# Patient Record
Sex: Female | Born: 1995 | Race: White | Hispanic: No | Marital: Single | Smoking: Former smoker
Health system: Southern US, Community
[De-identification: ages and names within clinical notes are randomized; demographics above are authoritative.]

## PROBLEM LIST (undated history)

## (undated) ENCOUNTER — Inpatient Hospital Stay (HOSPITAL_COMMUNITY): Payer: Self-pay

## (undated) DIAGNOSIS — R51 Headache: Secondary | ICD-10-CM

## (undated) DIAGNOSIS — I10 Essential (primary) hypertension: Secondary | ICD-10-CM

## (undated) DIAGNOSIS — F32A Depression, unspecified: Secondary | ICD-10-CM

## (undated) DIAGNOSIS — O0990 Supervision of high risk pregnancy, unspecified, unspecified trimester: Secondary | ICD-10-CM

## (undated) DIAGNOSIS — F411 Generalized anxiety disorder: Secondary | ICD-10-CM

## (undated) DIAGNOSIS — E669 Obesity, unspecified: Secondary | ICD-10-CM

## (undated) DIAGNOSIS — D649 Anemia, unspecified: Secondary | ICD-10-CM

## (undated) DIAGNOSIS — F329 Major depressive disorder, single episode, unspecified: Secondary | ICD-10-CM

## (undated) DIAGNOSIS — F902 Attention-deficit hyperactivity disorder, combined type: Secondary | ICD-10-CM

## (undated) DIAGNOSIS — F909 Attention-deficit hyperactivity disorder, unspecified type: Secondary | ICD-10-CM

## (undated) DIAGNOSIS — R519 Headache, unspecified: Secondary | ICD-10-CM

---
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
## 2014-02-05 ENCOUNTER — Encounter (HOSPITAL_COMMUNITY): Payer: Self-pay | Admitting: Emergency Medicine
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
## 2014-07-18 ENCOUNTER — Emergency Department (HOSPITAL_COMMUNITY): Payer: Medicaid Other

## 2014-07-18 ENCOUNTER — Encounter (HOSPITAL_COMMUNITY): Payer: Self-pay | Admitting: *Deleted

## 2014-07-18 ENCOUNTER — Emergency Department (HOSPITAL_COMMUNITY)
Admission: EM | Admit: 2014-07-18 | Discharge: 2014-07-19 | Disposition: A | Discharge status: Home / Self Care | Payer: Medicaid Other | Attending: Emergency Medicine | Admitting: Emergency Medicine

## 2014-07-18 DIAGNOSIS — R05 Cough: Secondary | ICD-10-CM | POA: Diagnosis not present

## 2014-07-18 DIAGNOSIS — R109 Unspecified abdominal pain: Secondary | ICD-10-CM | POA: Diagnosis present

## 2014-07-18 DIAGNOSIS — Z8659 Personal history of other mental and behavioral disorders: Secondary | ICD-10-CM | POA: Insufficient documentation

## 2014-07-18 DIAGNOSIS — Z87891 Personal history of nicotine dependence: Secondary | ICD-10-CM | POA: Diagnosis not present

## 2014-07-18 DIAGNOSIS — E669 Obesity, unspecified: Secondary | ICD-10-CM | POA: Insufficient documentation

## 2014-07-18 DIAGNOSIS — R8271 Bacteriuria: Secondary | ICD-10-CM

## 2014-07-18 DIAGNOSIS — Z3202 Encounter for pregnancy test, result negative: Secondary | ICD-10-CM | POA: Insufficient documentation

## 2014-07-18 DIAGNOSIS — N390 Urinary tract infection, site not specified: Secondary | ICD-10-CM | POA: Diagnosis not present

## 2014-07-18 DIAGNOSIS — R0781 Pleurodynia: Secondary | ICD-10-CM

## 2014-07-18 LAB — CBC WITH DIFFERENTIAL/PLATELET
BASOS ABS: 0 10*3/uL (ref 0.0–0.1)
Basophils Relative: 0 % (ref 0–1)
EOS PCT: 1 % (ref 0–5)
Eosinophils Absolute: 0.1 10*3/uL (ref 0.0–0.7)
HCT: 40.5 % (ref 36.0–46.0)
Hemoglobin: 13.6 g/dL (ref 12.0–15.0)
Lymphocytes Relative: 31 % (ref 12–46)
Lymphs Abs: 2.7 10*3/uL (ref 0.7–4.0)
MCH: 29.1 pg (ref 26.0–34.0)
MCHC: 33.6 g/dL (ref 30.0–36.0)
MCV: 86.7 fL (ref 78.0–100.0)
Monocytes Absolute: 0.7 10*3/uL (ref 0.1–1.0)
Monocytes Relative: 8 % (ref 3–12)
Neutro Abs: 5.2 10*3/uL (ref 1.7–7.7)
Neutrophils Relative %: 60 % (ref 43–77)
Platelets: 376 10*3/uL (ref 150–400)
RBC: 4.67 MIL/uL (ref 3.87–5.11)
RDW: 12.7 % (ref 11.5–15.5)
WBC: 8.7 10*3/uL (ref 4.0–10.5)

## 2014-07-18 LAB — COMPREHENSIVE METABOLIC PANEL
ALBUMIN: 3.9 g/dL (ref 3.5–5.2)
ALK PHOS: 108 U/L (ref 39–117)
ALT: 42 U/L — AB (ref 0–35)
AST: 25 U/L (ref 0–37)
Anion gap: 9 (ref 5–15)
BUN: 13 mg/dL (ref 6–23)
CO2: 25 mmol/L (ref 19–32)
CREATININE: 0.96 mg/dL (ref 0.50–1.10)
Calcium: 9.2 mg/dL (ref 8.4–10.5)
Chloride: 104 mmol/L (ref 96–112)
GFR calc Af Amer: >90 mL/min (ref >90)
GFR calc non Af Amer: 85 mL/min — ABNORMAL LOW (ref >90)
Glucose, Bld: 94 mg/dL (ref 70–99)
POTASSIUM: 3.9 mmol/L (ref 3.5–5.1)
Sodium: 138 mmol/L (ref 135–145)
Total Bilirubin: 0.5 mg/dL (ref 0.3–1.2)
Total Protein: 6.7 g/dL (ref 6.0–8.3)

## 2014-07-18 LAB — LIPASE, BLOOD: Lipase: 23 U/L (ref 11–59)

## 2014-07-18 NOTE — ED Notes (Signed)
Pt ambulated to restroom w/ steady gait.

## 2014-07-18 NOTE — ED Notes (Signed)
The pt is c/o lt flank pain for 3 weeks. She was seen one week ago and diagnosed with a uti.  She does not think it is a uti.  She was given meds but did niot get them filled no money.  She is oin her celll phone. Testing while being triaged.  Her periods have been irregular  Since she dekivered over a year ago.  lmp now

## 2014-07-18 NOTE — ED Provider Notes (Signed)
CSN: [id number]     Arrival date & time 07/18/14  2129 History   First MD Initiated Contact with Patient 07/18/14 2218     Chief Complaint  Patient presents with  . Flank Pain     (Consider location/radiation/quality/duration/timing/severity/associated sxs/prior Treatment) HPI  19 year old female presents with left midaxillary pain for the past 3 weeks. She was seen at [facility] one week ago and diagnosed with a UTI. She was put on antibiotics but did not have the money to fill them. She states the pain initially was constant 3 weeks ago but now has become more intermittent. He time she does anything such as cough or move a certain way it exacerbates the pain. No nausea or vomiting. She has had gallbladder issues but has had her gallbladder out. She denies any fevers or chills. No urinary symptoms including no dysuria or hematuria. Is currently on her menstrual cycle. No back pain. Coughs intermittently but no worse than typical, patient is a smoker. No pain currently while patient is at rest.  Past Medical History  Diagnosis Date  . Obesity   . 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 ADHD (attention deficit hyperactivity disorder) 04/17/2013   Past Surgical History  Procedure Laterality Date  . No past surgeries    . Cesarean section N/A 03/15/2013    Procedure: CESAREAN SECTION;  Surgeon: Catalina Antigua, MD;  Location: [facility];  Service: Obstetrics;  Laterality: N/A;  . Cholecystectomy N/A 04/16/2013    Procedure: LAPAROSCOPIC CHOLECYSTECTOMY WITH INTRAOPERATIVE CHOLANGIOGRAM;  Surgeon: Adolph Pollack, MD;  Location: [facility];  Service: General;  Laterality: N/A;   Family History  Problem Relation Age of Onset  . Adopted: Yes  . Drug abuse Mother   . Drug abuse Father    History  Substance Use Topics  . Smoking status: Former Games developer  . Smokeless tobacco: Never Used  . Alcohol Use: No     Comment: "maybe monthly"   OB History    Gravida  Para Term Preterm AB TAB SAB Ectopic Multiple Living   0 0 0 0 0 0 1     Review of Systems  Constitutional: Negative for fever.  Respiratory: Positive for cough. Negative for shortness of breath.   Cardiovascular: Negative for chest pain.  Gastrointestinal: Negative for nausea, vomiting and diarrhea.  Genitourinary: Positive for flank pain and vaginal bleeding (on period now). Negative for dysuria, urgency, frequency, hematuria, decreased urine volume and vaginal discharge.  Musculoskeletal: Negative for back pain.  All other systems reviewed and are negative.     Allergies  Review of patient's allergies indicates no known allergies.  Home Medications   Prior to Admission medications   Medication Sig Start Date End Date Taking? Authorizing Provider  bacitracin ointment Apply 1 application topically 2 (two) times daily. Apply to abrasions twice daily. Patient not taking: Reported on 07/18/2014 12/09/13   Purvis Sheffield, MD  oxyCODONE-acetaminophen (PERCOCET) 5-325 MG per tablet Take 1 tablet by mouth every 6 (six) hours as needed for moderate pain. Patient not taking: Reported on 07/18/2014 12/09/13   Purvis Sheffield, MD   BP 115/75 mmHg  Pulse 89  Temp(Src) 98.9 F (37.2 C)  Resp 18  Ht  (1.651 m)  Wt 175 lb (79.379 kg)  BMI 29.12 kg/m2  SpO2 100%  LMP 07/18/2014 Physical Exam  Constitutional: She is oriented to person, place, and time. She appears well-developed and well-nourished.  HENT:  Head: Normocephalic and atraumatic.  Right Ear: External ear normal.  Left Ear: External ear normal.  Nose: Nose normal.  Eyes: Right eye exhibits no discharge. Left eye exhibits no discharge.  Cardiovascular: Normal rate, regular rhythm and normal heart sounds.   Pulmonary/Chest: Effort normal and breath sounds normal. She has no wheezes. She has no rales.  Abdominal: Soft. There is no tenderness. There is no CVA tenderness.    Neurological: She is alert and oriented to  person, place, and time.  Skin: Skin is warm and dry. She is not diaphoretic.  Nursing note and vitals reviewed.   ED Course  Procedures (including critical care time) Labs Review Labs Reviewed  COMPREHENSIVE METABOLIC PANEL - Abnormal; Notable for the following:    ALT 42 (*)    GFR calc non Af Amer 85 (*)    All other components within normal limits  URINALYSIS, ROUTINE W REFLEX MICROSCOPIC - Abnormal; Notable for the following:    APPearance CLOUDY (*)    Leukocytes, UA LARGE (*)    All other components within normal limits  URINE MICROSCOPIC-ADD ON - Abnormal; Notable for the following:    Bacteria, UA FEW (*)    All other components within normal limits  CBC WITH DIFFERENTIAL/PLATELET  LIPASE, BLOOD  POC URINE PREG, ED    Imaging Review Dg Chest 2 View  07/18/2014   CLINICAL DATA:  Left lower axillary chest and flank pain, intermittent for 3 weeks. Dry cough and sore throat for 3 days.  EXAM: CHEST  2 VIEW  COMPARISON:  11/05/2013  FINDINGS: The cardiomediastinal contours are normal. The lungs are clear. Pulmonary vasculature is normal. No consolidation, pleural effusion, or pneumothorax. No acute osseous abnormalities are seen. Bilateral nipple rings present.  IMPRESSION: No acute pulmonary process.   Electronically Signed   By: Rubye OaksMelanie  Ehinger M.D.   On: 07/18/2014 23:39     EKG Interpretation None      MDM   Final diagnoses:  Rib pain on left side  Asymptomatic bacteriuria    After informed consent, outside records from [facility] were obtained. On 07/04/2014 she had a negative CT of the abdomen and pelvis without contrast. No evidence of stones or other acute abnormalities. At that time she also had a urinalysis that showed trace leukocytes. The urine culture did come back with greater than 100,000 Escherichia coli that was pansensitive. Patient has large leukocytes in her urine here but continues to have no urinary symptoms. I believe this is asymptomatic  bacteriuria. She is saying she has flank pain but when evaluated the pain is specifically over her lower ribs. I believe this is a muscle etiology. No CVA tenderness or suprapubic tenderness. Lab work is otherwise unremarkable. I recommended ibuprofen and Tylenol and discussed options for her a symptom back bacteria. She's not want antibiotics at this time. Discussed strict return precautions and have recommended follow-up with a PCP.    Pricilla LovelessScott Lakrisha Iseman, MD 07/19/14 [phone]

## 2014-07-19 LAB — URINE MICROSCOPIC-ADD ON

## 2014-07-19 LAB — URINALYSIS, ROUTINE W REFLEX MICROSCOPIC
Bilirubin Urine: NEGATIVE
GLUCOSE, UA: NEGATIVE mg/dL
HGB URINE DIPSTICK: NEGATIVE
Ketones, ur: NEGATIVE mg/dL
Nitrite: NEGATIVE
PH: 7.5 (ref 5.0–8.0)
Protein, ur: NEGATIVE mg/dL
Specific Gravity, Urine: 1.026 (ref 1.005–1.030)
Urobilinogen, UA: 0.2 mg/dL (ref 0.0–1.0)

## 2014-07-19 LAB — POC URINE PREG, ED: PREG TEST UR: NEGATIVE

## 2014-07-19 NOTE — Discharge Instructions (Signed)
Asymptomatic Bacteriuria Asymptomatic bacteriuria is the presence of a large number of bacteria in your urine without the usual symptoms of burning or frequent urination. The following conditions increase the risk of asymptomatic bacteriuria:  Diabetes mellitus.  Advanced age.  Pregnancy in the first trimester.  Kidney stones.  Kidney transplants.  Leaky kidney tube valve in young children (reflux). Treatment for this condition is not needed in most people and can lead to other problems such as too much yeast and growth of resistant bacteria. However, some people, such as pregnant women, do need treatment to prevent kidney infection. Asymptomatic bacteriuria in pregnancy is also associated with fetal growth restriction, premature labor, and newborn death. HOME CARE INSTRUCTIONS Monitor your condition for any changes. The following actions may help to relieve any discomfort you are feeling:  Drink enough water and fluids to keep your urine clear or pale yellow. Go to the bathroom more often to keep your bladder empty.  Keep the area around your vagina and rectum clean. Wipe yourself from front to back after urinating. SEEK IMMEDIATE MEDICAL CARE IF:  You develop signs of an infection such as:  Burning with urination.  Frequency of voiding.  Back pain.  Fever.  You have blood in the urine.  You develop a fever. MAKE SURE YOU:  Understand these instructions.  Will watch your condition.  Will get help right away if you are not doing well or get worse. Document Released: 03/23/2005 Document Revised: 08/07/2013 Document Reviewed: 09/12/2012 [facility] Patient Information 2015 [place], [place]. This information is not intended to replace advice given to you by your health care provider. Make sure you discuss any questions you have with your health care provider.    Chest Wall Pain Chest wall pain is pain in or around the bones and muscles of your chest. It may take up to 6  weeks to get better. It may take longer if you must stay physically active in your work and activities.  CAUSES  Chest wall pain may happen on its own. However, it may be caused by:  A viral illness like the flu.  Injury.  Coughing.  Exercise.  Arthritis.  Fibromyalgia.  Shingles. HOME CARE INSTRUCTIONS   Avoid overtiring physical activity. Try not to strain or perform activities that cause pain. This includes any activities using your chest or your abdominal and side muscles, especially if heavy weights are used.  Put ice on the sore area.  Put ice in a plastic bag.  Place a towel between your skin and the bag.  Leave the ice on for 15-20 minutes per hour while awake for the first 2 days.  Only take over-the-counter or prescription medicines for pain, discomfort, or fever as directed by your caregiver. SEEK IMMEDIATE MEDICAL CARE IF:   Your pain increases, or you are very uncomfortable.  You have a fever.  Your chest pain becomes worse.  You have new, unexplained symptoms.  You have nausea or vomiting.  You feel sweaty or lightheaded.  You have a cough with phlegm (sputum), or you cough up blood. MAKE SURE YOU:   Understand these instructions.  Will watch your condition.  Will get help right away if you are not doing well or get worse. Document Released: 03/23/2005 Document Revised: 06/15/2011 Document Reviewed: 11/17/2010 [facility] Patient Information 2015 [place], [place]. This information is not intended to replace advice given to you by your health care provider. Make sure you discuss any questions you have with your health care provider.  Flank Pain Flank pain refers to pain that is located on the side of the body between the upper abdomen and the back. The pain may occur over a short period of time (acute) or may be long-term or reoccurring (chronic). It may be mild or severe. Flank pain can be caused by many things. CAUSES  Some of the more common  causes of flank pain include:  Muscle strains.   Muscle spasms.   A disease of your spine (vertebral disk disease).   A lung infection (pneumonia).   Fluid around your lungs (pulmonary edema).   A kidney infection.   Kidney stones.   A very painful skin rash caused by the chickenpox virus (shingles).   Gallbladder disease.  HOME CARE INSTRUCTIONS  Home care will depend on the cause of your pain. In general,  Rest as directed by your caregiver.  Drink enough fluids to keep your urine clear or pale yellow.  Only take over-the-counter or prescription medicines as directed by your caregiver. Some medicines may help relieve the pain.  Tell your caregiver about any changes in your pain.  Follow up with your caregiver as directed. SEEK IMMEDIATE MEDICAL CARE IF:   Your pain is not controlled with medicine.   You have new or worsening symptoms.  Your pain increases.   You have abdominal pain.   You have shortness of breath.   You have persistent nausea or vomiting.   You have swelling in your abdomen.   You feel faint or pass out.   You have blood in your urine.  You have a fever or persistent symptoms for more than 2-3 days.  You have a fever and your symptoms suddenly get worse. MAKE SURE YOU:   Understand these instructions.  Will watch your condition.  Will get help right away if you are not doing well or get worse. Document Released: 05/14/2005 Document Revised: 12/16/2011 Document Reviewed: 11/05/2011 [facility] Patient Information 2015 [place], [place]. This information is not intended to replace advice given to you by your health care provider. Make sure you discuss any questions you have with your health care provider.

## 2014-08-04 IMAGING — US US ABDOMEN COMPLETE
1 series · 14 of 25 positions shown · non-contrast
Comparison: None.

CLINICAL DATA: Abdominal pain.  Rule out cholelithiasis.

EXAM:
ULTRASOUND ABDOMEN COMPLETE

[Series 1: us abdomen complete · 0.27mm/px · 14 of 73 slices shown]
[im 1/73]
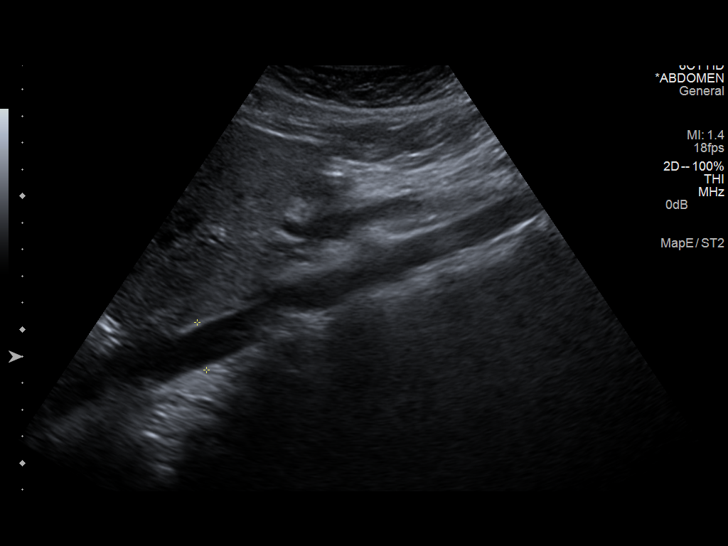
[im 7/73]
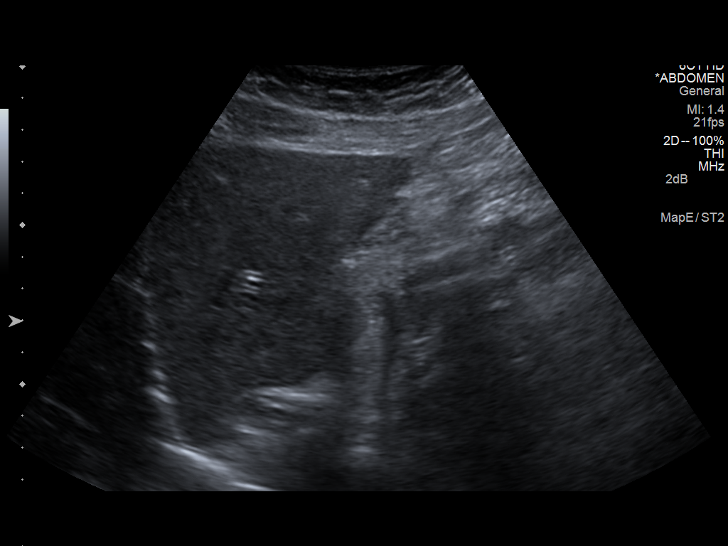
[im 13/73]
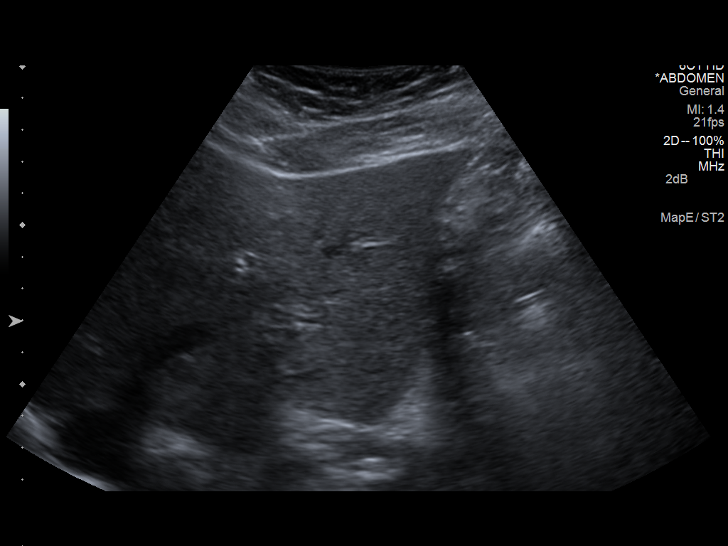
[im 19/73]
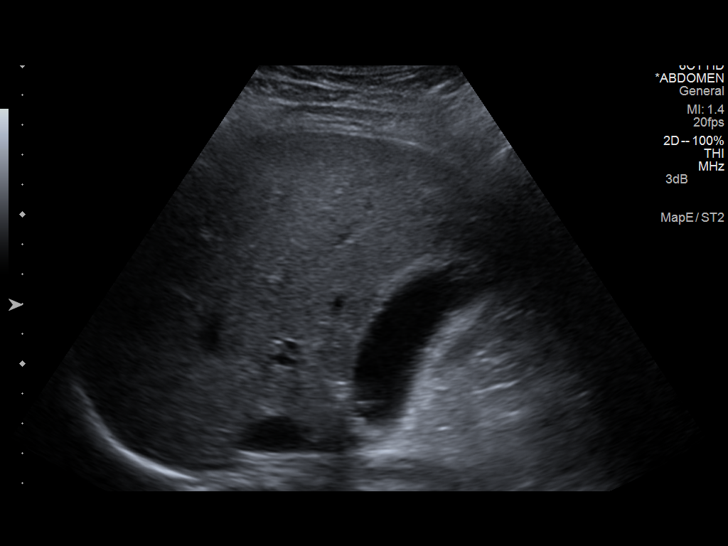
[im 25/73]
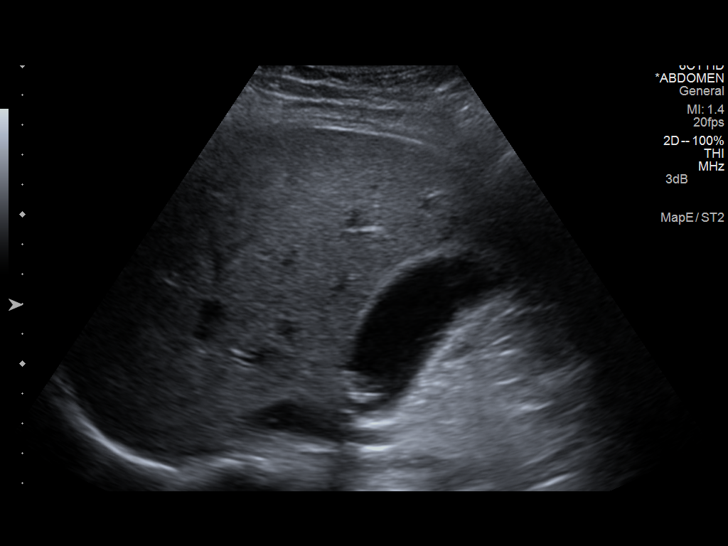
[im 28/73]
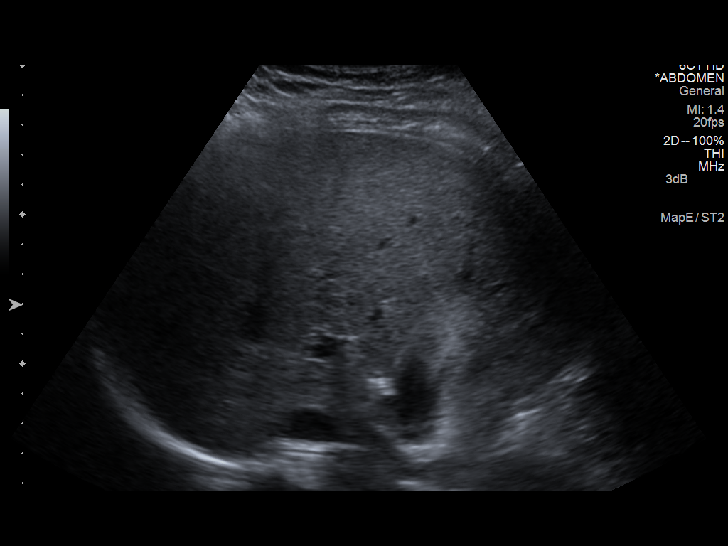
[im 34/73]
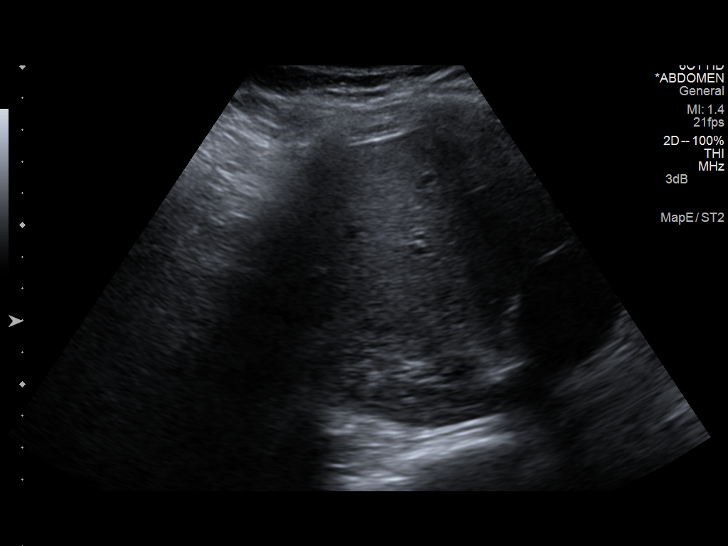
[im 40/73]
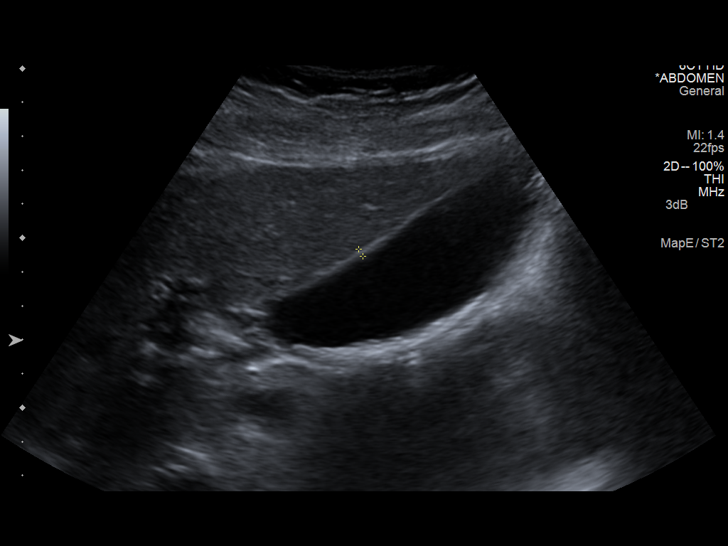
[im 46/73]
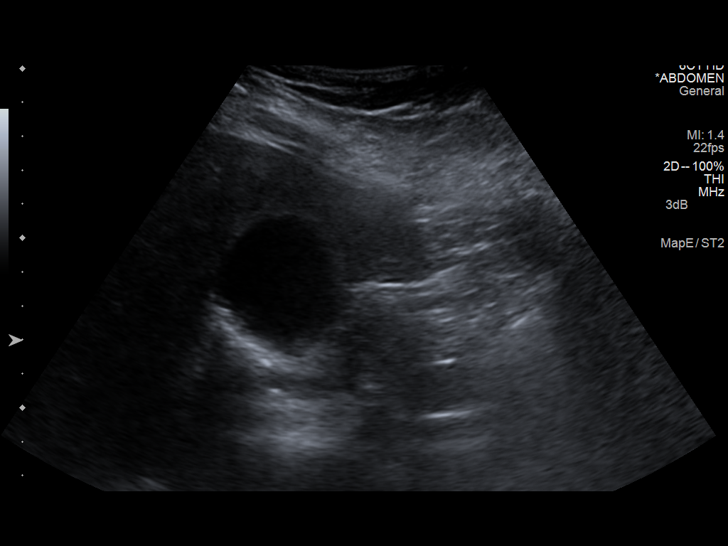
[im 49/73]
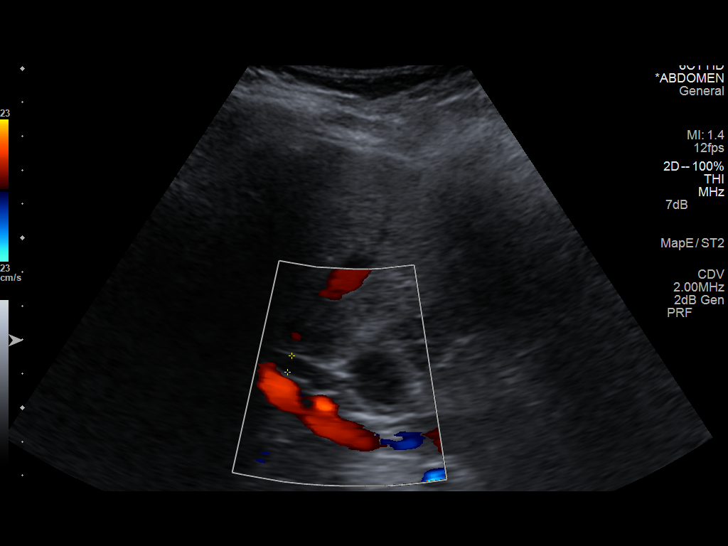
[im 55/73]
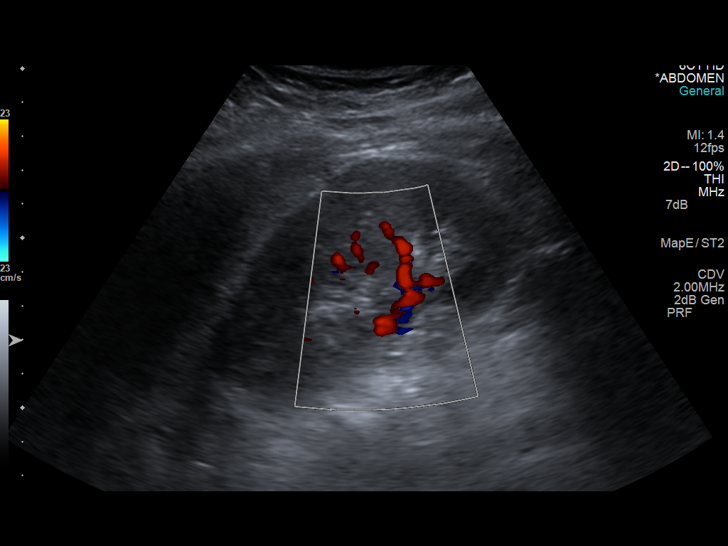
[im 61/73]
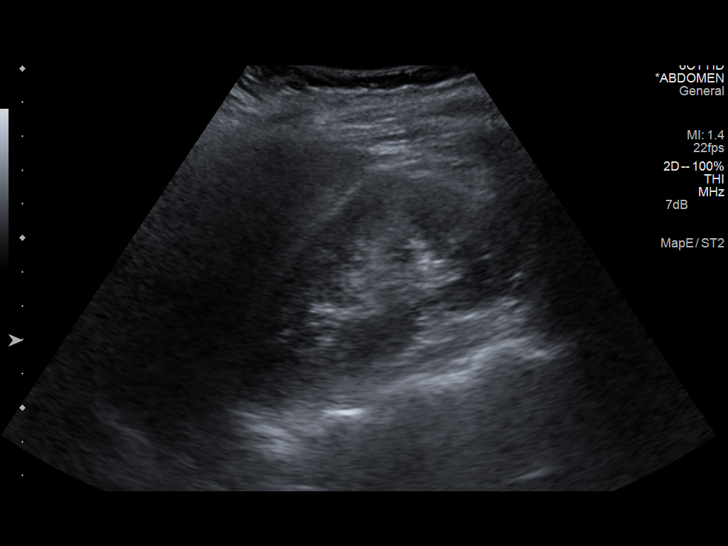
[im 67/73]
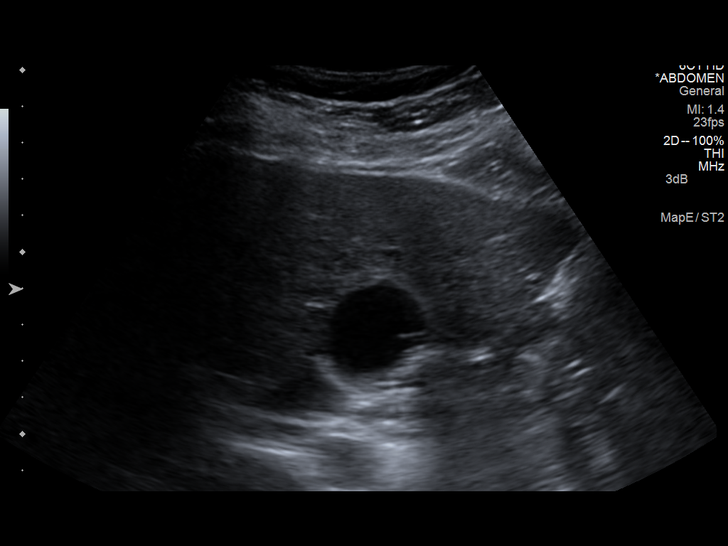
[im 73/73]
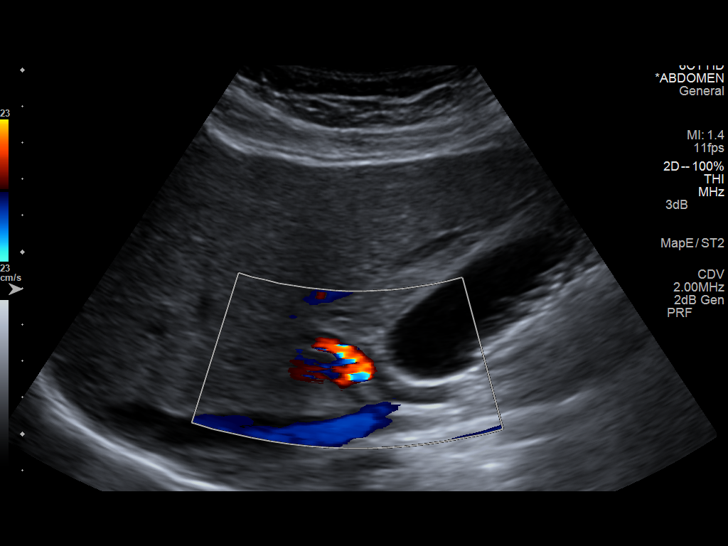

[14 of 25 positions shown; findings below may reference images not displayed]

FINDINGS: Gallbladder:

Multiple gallstones. Mobile. No wall thickening or pericholecystic
fluid. Sonographic Murphy's sign was not elicited.

Common bile duct:

Diameter: Normal, 5 mm.

Liver:

No focal lesion identified. Within normal limits in parenchymal
echogenicity.

IVC:

No abnormality visualized.

Pancreas:

Visualized portion unremarkable.

Spleen:

Size and appearance within normal limits.

Right Kidney:

Length: 10.2 cm. Echogenicity within normal limits. No mass or
hydronephrosis visualized.

Left Kidney:

Length: 10.3 cm. Echogenicity within normal limits. No mass or
hydronephrosis visualized.

Abdominal aorta:

No aneurysm visualized.

Other findings:

None.
IMPRESSION: Cholelithiasis without acute cholecystitis or biliary ductal
dilatation.

## 2014-08-06 IMAGING — RF DG CHOLANGIOGRAM OPERATIVE
1 series · 4 of 4 positions shown · non-contrast
Comparison: 04/14/2013

CLINICAL DATA: Cholecystectomy, intraoperative cholangiogram,
cholelithiasis

EXAM:
INTRAOPERATIVE CHOLANGIOGRAM
TECHNIQUE: Cholangiographic images from the C-arm fluoroscopic device were
submitted for interpretation post-operatively. Please see the
procedural report for the amount of contrast and the fluoroscopy
time utilized.

[Series 1: run · 4 of 77 frames shown]
[frame 12/77]
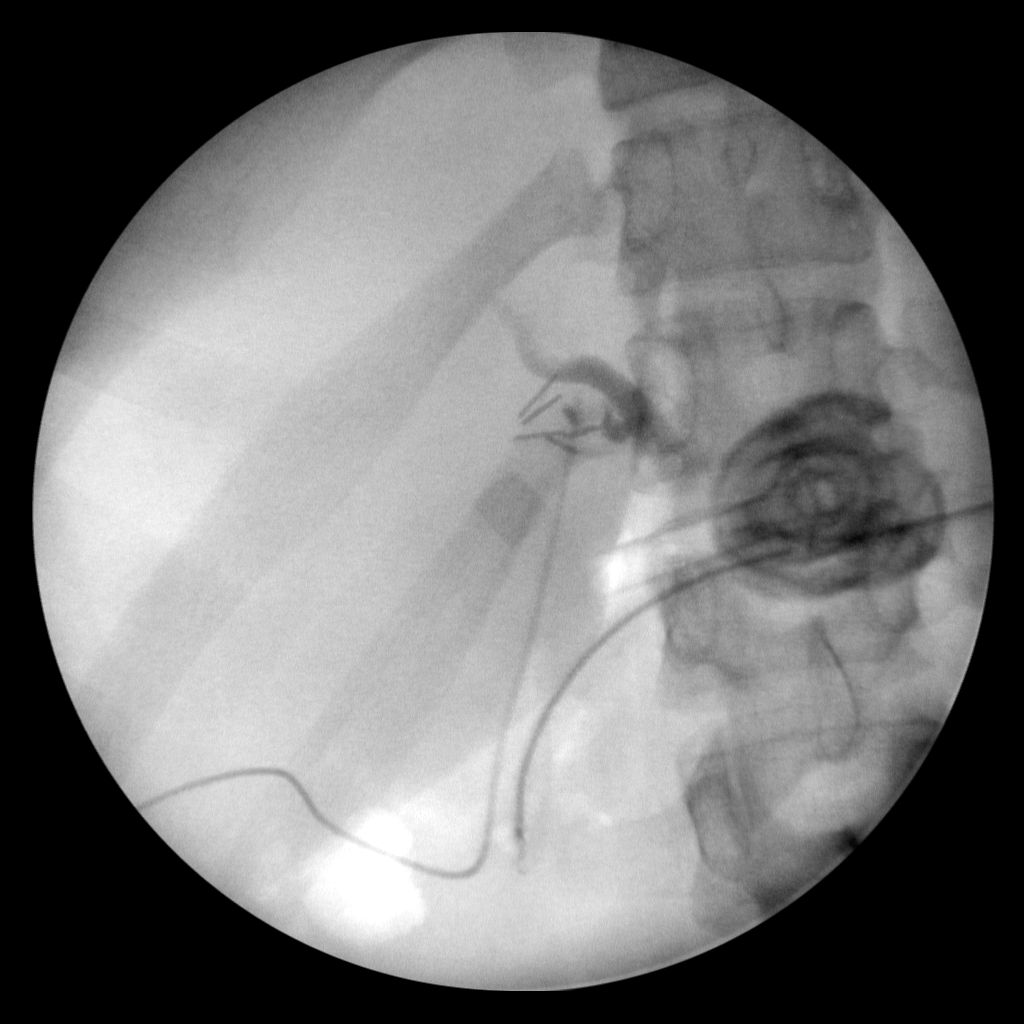
[frame 39/77]
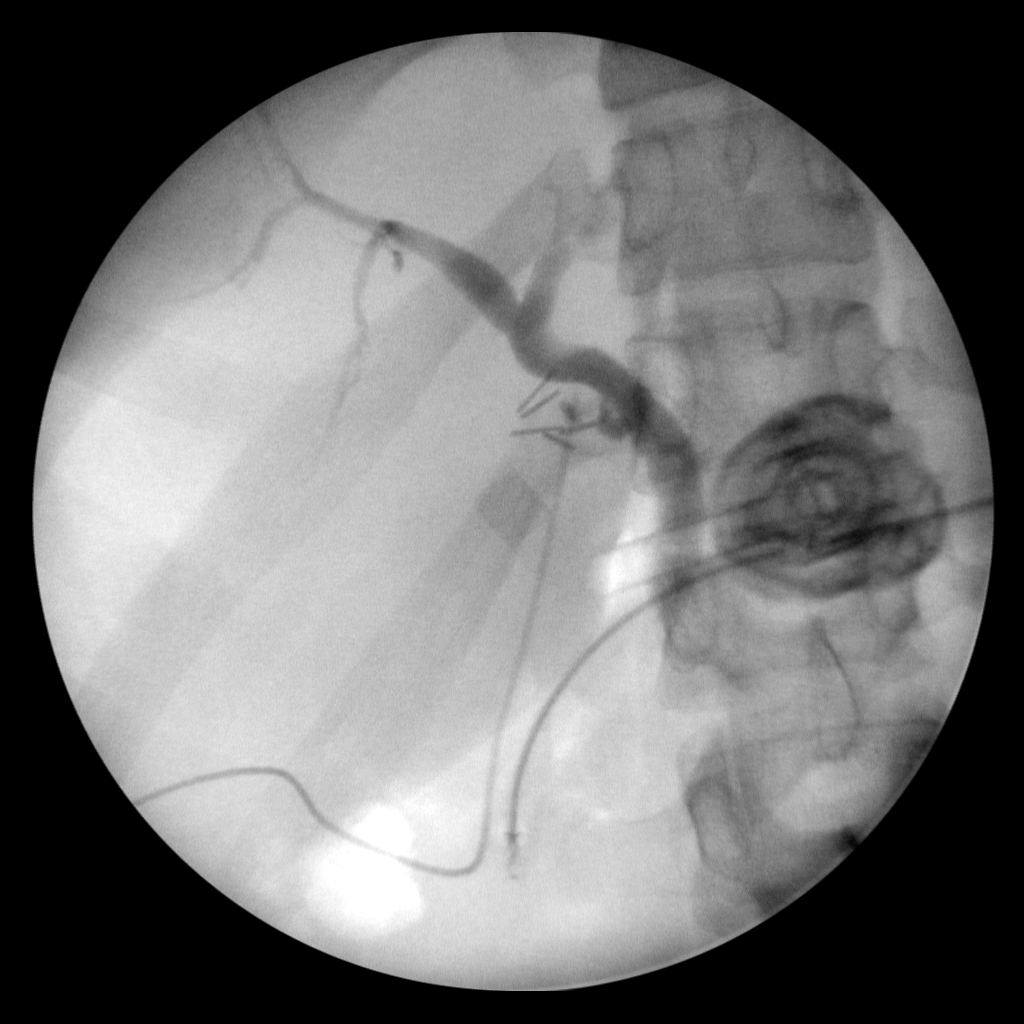
[frame 43/77]
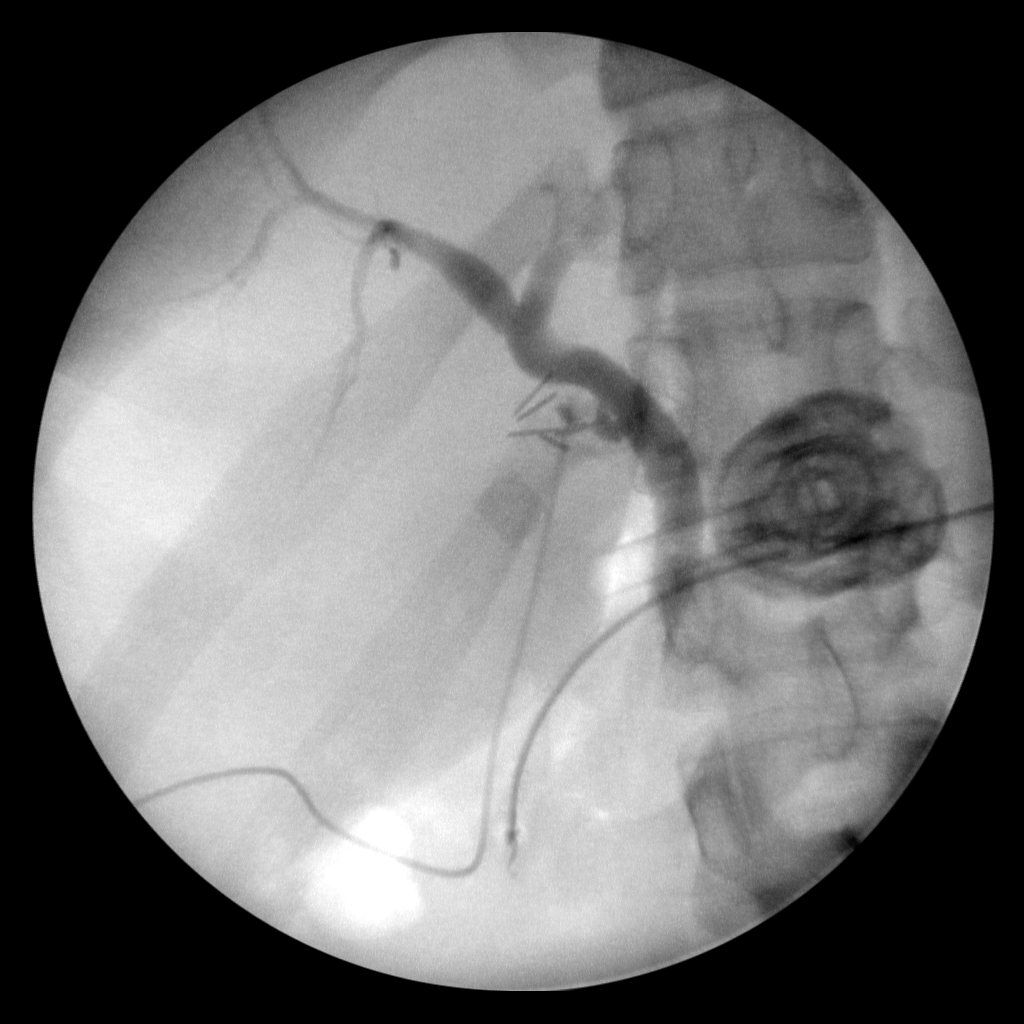
[frame 66/77]
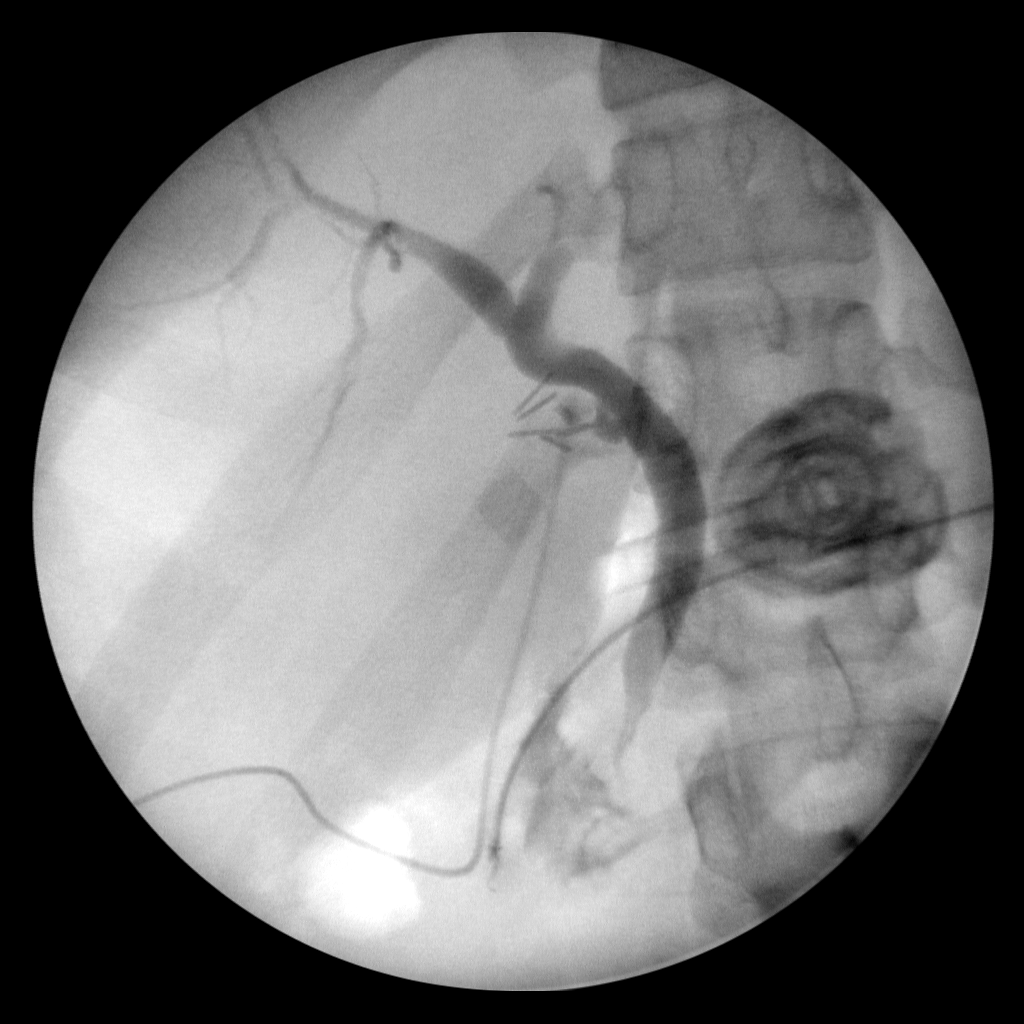

[4 of 4 positions shown; findings below may reference images not displayed]

FINDINGS: Intraoperative cholangiogram performed during laparoscopic
cholecystectomy. Biliary confluence, common hepatic duct, residual
cystic duct, and common bile duct are all patent. Negative for
dilatation or obstruction. No filling defect or retained stone.
Contrast opacifies the duodenum.
IMPRESSION: Patent biliary system.

## 2014-08-14 ENCOUNTER — Emergency Department (HOSPITAL_COMMUNITY)
Admission: EM | Admit: 2014-08-14 | Discharge: 2014-08-14 | Disposition: A | Discharge status: Home / Self Care | Payer: Medicaid Other | Attending: Emergency Medicine | Admitting: Emergency Medicine

## 2014-08-14 ENCOUNTER — Encounter (HOSPITAL_COMMUNITY): Payer: Self-pay | Admitting: Physical Medicine and Rehabilitation

## 2014-08-14 DIAGNOSIS — Z87891 Personal history of nicotine dependence: Secondary | ICD-10-CM | POA: Diagnosis not present

## 2014-08-14 DIAGNOSIS — Z3202 Encounter for pregnancy test, result negative: Secondary | ICD-10-CM | POA: Insufficient documentation

## 2014-08-14 DIAGNOSIS — K5900 Constipation, unspecified: Secondary | ICD-10-CM | POA: Diagnosis not present

## 2014-08-14 DIAGNOSIS — N390 Urinary tract infection, site not specified: Secondary | ICD-10-CM

## 2014-08-14 DIAGNOSIS — N898 Other specified noninflammatory disorders of vagina: Secondary | ICD-10-CM

## 2014-08-14 DIAGNOSIS — Z792 Long term (current) use of antibiotics: Secondary | ICD-10-CM | POA: Diagnosis not present

## 2014-08-14 DIAGNOSIS — E669 Obesity, unspecified: Secondary | ICD-10-CM | POA: Diagnosis not present

## 2014-08-14 DIAGNOSIS — Z202 Contact with and (suspected) exposure to infections with a predominantly sexual mode of transmission: Secondary | ICD-10-CM | POA: Diagnosis present

## 2014-08-14 DIAGNOSIS — R197 Diarrhea, unspecified: Secondary | ICD-10-CM | POA: Diagnosis not present

## 2014-08-14 DIAGNOSIS — Z8659 Personal history of other mental and behavioral disorders: Secondary | ICD-10-CM | POA: Diagnosis not present

## 2014-08-14 LAB — WET PREP, GENITAL
CLUE CELLS WET PREP: NONE SEEN
Trich, Wet Prep: NONE SEEN
YEAST WET PREP: NONE SEEN

## 2014-08-14 LAB — URINALYSIS, ROUTINE W REFLEX MICROSCOPIC
Bilirubin Urine: NEGATIVE
GLUCOSE, UA: NEGATIVE mg/dL
KETONES UR: NEGATIVE mg/dL
NITRITE: NEGATIVE
PH: 7 (ref 5.0–8.0)
Protein, ur: NEGATIVE mg/dL
SPECIFIC GRAVITY, URINE: 1.021 (ref 1.005–1.030)
Urobilinogen, UA: 0.2 mg/dL (ref 0.0–1.0)

## 2014-08-14 LAB — URINE MICROSCOPIC-ADD ON

## 2014-08-14 LAB — POC URINE PREG, ED: Preg Test, Ur: NEGATIVE

## 2014-08-14 MED ORDER — AZITHROMYCIN 250 MG PO TABS
1000.0000 mg | ORAL_TABLET | Freq: Once | ORAL | Status: AC
  Administered 2014-08-14: 1000 mg via ORAL
  Filled 2014-08-14: qty 4

## 2014-08-14 MED ORDER — CEFTRIAXONE SODIUM 250 MG IJ SOLR
250.0000 mg | Freq: Once | INTRAMUSCULAR | Status: AC
  Administered 2014-08-14: 250 mg via INTRAMUSCULAR
  Filled 2014-08-14: qty 250

## 2014-08-14 MED ORDER — PHENAZOPYRIDINE HCL 200 MG PO TABS: 200.0000 mg | ORAL_TABLET | Freq: Three times a day (TID) | ORAL | Status: DC

## 2014-08-14 MED ORDER — SULFAMETHOXAZOLE-TRIMETHOPRIM 800-160 MG PO TABS: 1.0000 | ORAL_TABLET | Freq: Two times a day (BID) | ORAL | Status: AC

## 2014-08-14 NOTE — ED Provider Notes (Signed)
CSN: [id number]     Arrival date & time 08/14/14  1118 History  This chart was scribed for non-physician practitioner, Trixie DredgeEmily Dietra Stokely, PA-C, working with Bethann BerkshireJoseph Zammit, MD, by Lionel DecemberHatice Demirci, ED Scribe. This patient was seen in room TR02C/TR02C and the patient's care was started at 1:50 PM.    Chief Complaint  Patient presents with  . Urinary Tract Infection  . Exposure to STD     (Consider location/radiation/quality/duration/timing/severity/associated sxs/prior Treatment) The history is provided by the patient.    HPI Comments: Morgan Lewis is a 19 y.o. female who presents to the Emergency Department complaining of a possible urinary tract infection and exposure to an STD.  She has associated symptoms of dysuria,feels itching as well as odorous clumpy discharge onset a couple of weeks ago.Patient threw up a couple times last week and is unsure if she is pregnant or not.  She had constipation two days ago which turned into diarrhea, no BM since.  Patient was here 3-4 weeks ago and had a UTI.  Patient does not have a job so she could not afford the antibiotics.   Her menstrual cycle is not regular and last had her period 2 months ago. She has no other complaints today.    Past Medical History  Diagnosis Date  . Obesity   . 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 ADHD (attention deficit hyperactivity disorder) 04/17/2013   Past Surgical History  Procedure Laterality Date  . No past surgeries    . Cesarean section N/A 03/15/2013    Procedure: CESAREAN SECTION;  Surgeon: Catalina AntiguaPeggy Constant, MD;  Location: [facility];  Service: Obstetrics;  Laterality: N/A;  . Cholecystectomy N/A 04/16/2013    Procedure: LAPAROSCOPIC CHOLECYSTECTOMY WITH INTRAOPERATIVE CHOLANGIOGRAM;  Surgeon: Adolph Pollackodd J Rosenbower, MD;  Location: [facility];  Service: General;  Laterality: N/A;   Family History  Problem Relation Age of Onset  . Adopted: Yes  . Drug abuse Mother   . Drug abuse Father     History  Substance Use Topics  . Smoking status: Former Games developermoker  . Smokeless tobacco: Never Used  . Alcohol Use: No     Comment: "maybe monthly"   OB History    Gravida Para Term Preterm AB TAB SAB Ectopic Multiple Living   1 1 1  0 0 0 0 0 0 1     Review of Systems  Constitutional: Negative for fever and chills.  Respiratory: Negative for cough, choking and shortness of breath.   Cardiovascular: Negative for chest pain.  Gastrointestinal: Positive for diarrhea and constipation. Negative for nausea and vomiting.  Genitourinary: Positive for dysuria, vaginal discharge and vaginal pain.  Musculoskeletal: Negative for myalgias.  Allergic/Immunologic: Negative for immunocompromised state.  Hematological: Does not bruise/bleed easily.      Allergies  Review of patient's allergies indicates no known allergies.  Home Medications   Prior to Admission medications   Medication Sig Start Date End Date Taking? Authorizing Provider  bacitracin ointment Apply 1 application topically 2 (two) times daily. Apply to abrasions twice daily. Patient not taking: Reported on 07/18/2014 12/09/13   Purvis SheffieldForrest Harrison, MD  oxyCODONE-acetaminophen (PERCOCET) 5-325 MG per tablet Take 1 tablet by mouth every 6 (six) hours as needed for moderate pain. Patient not taking: Reported on 07/18/2014 12/09/13   Purvis SheffieldForrest Harrison, MD   BP 115/65 mmHg  Pulse 69  Temp(Src) 98.8 F (37.1 C) (Oral)  Resp 18  SpO2 99%  LMP 07/18/2014 Physical Exam  Constitutional: She appears well-developed and well-nourished.  No distress.  HENT:  Head: Normocephalic and atraumatic.  Neck: Neck supple.  Cardiovascular: Normal rate and regular rhythm.   Pulmonary/Chest: Effort normal and breath sounds normal. No respiratory distress. She has no wheezes. She has no rales.  Abdominal: Soft. She exhibits no distension. There is no tenderness. There is no rebound and no guarding.  Genitourinary: Uterus is not fixed and not tender.  Cervix exhibits no motion tenderness and no discharge. Right adnexum displays no mass, no tenderness and no fullness. Left adnexum displays no mass, no tenderness and no fullness. No erythema, tenderness or bleeding in the vagina. No foreign body around the vagina. No signs of injury around the vagina. No vaginal discharge found.  Neurological: She is alert.  Skin: She is not diaphoretic.  Nursing note and vitals reviewed.   ED Course  Procedures (including critical care time) DIAGNOSTIC STUDIES: Oxygen Saturation is 99% on RA, normal by my interpretation.    COORDINATION OF CARE: 1:57 PM Discussed treatment plan with patient at beside, the patient agrees with the plan and has no further questions at this time.   Labs Review Labs Reviewed  WET PREP, GENITAL - Abnormal; Notable for the following:    WBC, Wet Prep HPF POC FEW (*)    All other components within normal limits  URINALYSIS, ROUTINE W REFLEX MICROSCOPIC - Abnormal; Notable for the following:    APPearance TURBID (*)    Hgb urine dipstick SMALL (*)    Leukocytes, UA LARGE (*)    All other components within normal limits  URINE MICROSCOPIC-ADD ON - Abnormal; Notable for the following:    Squamous Epithelial / LPF FEW (*)    Bacteria, UA MANY (*)    All other components within normal limits  HIV ANTIBODY (ROUTINE TESTING)  POC URINE PREG, ED  GC/CHLAMYDIA PROBE AMP ([facility] [place])    Imaging Review No results found.   EKG Interpretation None      MDM   Final diagnoses:  UTI (lower urinary tract infection)  Vaginal discharge    Afebrile, nontoxic patient with dysuria, abnormal vaginal discharge x several weeks.  No abdominal pain or tenderness.  UA appears infected.  Upreg negative.  Wet prep unremarkable.  GC/Chlam pending.  Pt elects to be treated for GC/Chlam while in ED.   D/C home with bactrim, pyridium.  Discussed result, findings, treatment, and follow up  with patient.  Pt given return precautions.  Pt  verbalizes understanding and agrees with plan.       I personally performed the services described in this documentation, which was scribed in my presence. The recorded information has been reviewed and is accurate.    Trixie Dredgemily Laurian Edrington, PA-C 08/14/14 1534  Geoffery Lyonsouglas Delo, MD 08/14/14 [phone]

## 2014-08-14 NOTE — ED Notes (Signed)
Pt presents to department for evaluation of possible UTI and also requesting to get checked for STD. Reports she was diagnosed with UTI several weeks ago, but could not afford antibiotics.

## 2014-08-14 NOTE — ED Notes (Signed)
Patient states she "think I might have been given something by my boyfriend.  He had to get a shot for something that I don't know and now I might have it".

## 2014-08-14 NOTE — Discharge Instructions (Signed)
Read the information below.  Use the prescribed medication as directed.  Please discuss all new medications with your pharmacist.  You may return to the Emergency Department at any time for worsening condition or any new symptoms that concern you.    The antibiotic prescription (Bactrim) [facility] and may be free at [employer] (many antibiotics are free there).  If you develop high fevers, abdominal pain, uncontrolled vomiting, or are unable to tolerate fluids by mouth, return to the ER for a recheck.     Urinary Tract Infection A urinary tract infection (UTI) can occur any place along the urinary tract. The tract includes the kidneys, ureters, bladder, and urethra. A type of germ called bacteria often causes a UTI. UTIs are often helped with antibiotic medicine.  HOME CARE   If given, take antibiotics as told by your doctor. Finish them even if you start to feel better.  Drink enough fluids to keep your pee (urine) clear or pale yellow.  Avoid tea, drinks with caffeine, and bubbly (carbonated) drinks.  Pee often. Avoid holding your pee in for a long time.  Pee before and after having sex (intercourse).  Wipe from front to back after you poop (bowel movement) if you are a woman. Use each tissue only once. GET HELP RIGHT AWAY IF:   You have back pain.  You have lower belly (abdominal) pain.  You have chills.  You feel sick to your stomach (nauseous).  You throw up (vomit).  Your burning or discomfort with peeing does not go away.  You have a fever.  Your symptoms are not better in 3 days. MAKE SURE YOU:   Understand these instructions.  Will watch your condition.  Will get help right away if you are not doing well or get worse. Document Released: 09/09/2007 Document Revised: 12/16/2011 Document Reviewed: 10/22/2011 [facility] Patient Information 2015 [place], [place]. This information is not intended to replace advice given to you by your health care  provider. Make sure you discuss any questions you have with your health care provider.

## 2014-08-14 NOTE — ED Notes (Signed)
Patient urine pregnancy was negative. 

## 2014-08-15 LAB — HIV ANTIBODY (ROUTINE TESTING W REFLEX): HIV Screen 4th Generation wRfx: NONREACTIVE

## 2014-08-15 LAB — GC/CHLAMYDIA PROBE AMP (~~LOC~~) NOT AT ARMC
CHLAMYDIA, DNA PROBE: NEGATIVE
NEISSERIA GONORRHEA: POSITIVE — AB

## 2014-08-16 ENCOUNTER — Telehealth (HOSPITAL_BASED_OUTPATIENT_CLINIC_OR_DEPARTMENT_OTHER): Payer: Self-pay | Admitting: Emergency Medicine

## 2014-08-31 ENCOUNTER — Telehealth (HOSPITAL_BASED_OUTPATIENT_CLINIC_OR_DEPARTMENT_OTHER): Payer: Self-pay | Admitting: Emergency Medicine

## 2014-08-31 NOTE — Telephone Encounter (Signed)
Letter returned no known forwarding address, lost to followup

## 2014-10-08 ENCOUNTER — Telehealth (HOSPITAL_COMMUNITY): Payer: Self-pay

## 2014-10-08 NOTE — ED Notes (Signed)
Unable to contact pt by mail or telephone. Unable to communicate lab results or treatment changes. 

## 2014-11-25 ENCOUNTER — Inpatient Hospital Stay (HOSPITAL_COMMUNITY): Payer: Medicaid Other

## 2014-11-25 ENCOUNTER — Inpatient Hospital Stay (HOSPITAL_COMMUNITY)
Admission: AD | Admit: 2014-11-25 | Discharge: 2014-11-25 | Disposition: A | Discharge status: Home / Self Care | Payer: Medicaid Other | Source: Ambulatory Visit | Attending: Obstetrics & Gynecology | Admitting: Obstetrics & Gynecology

## 2014-11-25 ENCOUNTER — Encounter (HOSPITAL_COMMUNITY): Payer: Self-pay

## 2014-11-25 DIAGNOSIS — B9689 Other specified bacterial agents as the cause of diseases classified elsewhere: Secondary | ICD-10-CM | POA: Diagnosis not present

## 2014-11-25 DIAGNOSIS — O26899 Other specified pregnancy related conditions, unspecified trimester: Secondary | ICD-10-CM

## 2014-11-25 DIAGNOSIS — F909 Attention-deficit hyperactivity disorder, unspecified type: Secondary | ICD-10-CM | POA: Insufficient documentation

## 2014-11-25 DIAGNOSIS — O9989 Other specified diseases and conditions complicating pregnancy, childbirth and the puerperium: Secondary | ICD-10-CM

## 2014-11-25 DIAGNOSIS — Z87891 Personal history of nicotine dependence: Secondary | ICD-10-CM | POA: Insufficient documentation

## 2014-11-25 DIAGNOSIS — O99341 Other mental disorders complicating pregnancy, first trimester: Secondary | ICD-10-CM | POA: Diagnosis not present

## 2014-11-25 DIAGNOSIS — Z3A01 Less than 8 weeks gestation of pregnancy: Secondary | ICD-10-CM | POA: Diagnosis not present

## 2014-11-25 DIAGNOSIS — R109 Unspecified abdominal pain: Secondary | ICD-10-CM

## 2014-11-25 DIAGNOSIS — A499 Bacterial infection, unspecified: Secondary | ICD-10-CM | POA: Diagnosis not present

## 2014-11-25 DIAGNOSIS — E669 Obesity, unspecified: Secondary | ICD-10-CM | POA: Insufficient documentation

## 2014-11-25 DIAGNOSIS — N760 Acute vaginitis: Secondary | ICD-10-CM | POA: Diagnosis not present

## 2014-11-25 DIAGNOSIS — R8271 Bacteriuria: Secondary | ICD-10-CM

## 2014-11-25 DIAGNOSIS — O23591 Infection of other part of genital tract in pregnancy, first trimester: Secondary | ICD-10-CM | POA: Diagnosis present

## 2014-11-25 LAB — URINALYSIS, ROUTINE W REFLEX MICROSCOPIC
Bilirubin Urine: NEGATIVE
Glucose, UA: NEGATIVE mg/dL
Ketones, ur: NEGATIVE mg/dL
Nitrite: NEGATIVE
Protein, ur: NEGATIVE mg/dL
Specific Gravity, Urine: 1.025 (ref 1.005–1.030)
Urobilinogen, UA: 0.2 mg/dL (ref 0.0–1.0)
pH: 6 (ref 5.0–8.0)

## 2014-11-25 LAB — URINE MICROSCOPIC-ADD ON

## 2014-11-25 LAB — CBC
HCT: 36.6 % (ref 36.0–46.0)
Hemoglobin: 12.7 g/dL (ref 12.0–15.0)
MCH: 29.4 pg (ref 26.0–34.0)
MCHC: 34.7 g/dL (ref 30.0–36.0)
MCV: 84.7 fL (ref 78.0–100.0)
Platelets: 336 K/uL (ref 150–400)
RBC: 4.32 MIL/uL (ref 3.87–5.11)
RDW: 13.5 % (ref 11.5–15.5)
WBC: 7.5 K/uL (ref 4.0–10.5)

## 2014-11-25 LAB — WET PREP, GENITAL: TRICH WET PREP: NONE SEEN

## 2014-11-25 LAB — HCG, QUANTITATIVE, PREGNANCY: hCG, Beta Chain, Quant, S: 6391 m[IU]/mL — ABNORMAL HIGH

## 2014-11-25 LAB — POCT PREGNANCY, URINE: Preg Test, Ur: POSITIVE — AB

## 2014-11-25 MED ORDER — METRONIDAZOLE 500 MG PO TABS: 500.0000 mg | ORAL_TABLET | Freq: Two times a day (BID) | ORAL | Status: DC

## 2014-11-25 MED ORDER — NITROFURANTOIN MONOHYD MACRO 100 MG PO CAPS: 100.0000 mg | ORAL_CAPSULE | Freq: Two times a day (BID) | ORAL | Status: DC

## 2014-11-25 NOTE — MAU Provider Note (Signed)
History     CSN: [id number]  Arrival date and time: 11/25/14 1105   None     Chief Complaint  Patient presents with  . Abdominal Pain    Reports pain for 2weeks; intermittent   HPI Morgan Lewis is 19 y.o. G2P1001 Unknown weeks presenting intermittent abdominal pain X 2 weeks. Describes as sharp in the lower abdomen.  Rates as 8/10 at its worse and 5/10 currently.  Has used ibuprofen with some relief. Standing makes it worse and lying on stomach makes it better.  Nausea without vomiting.   Began with pregnancy sxs 4-5 weeks ago. Seen at [facility] 4 days ago and dx with + UPT and UTI-- has not filled Rx b/c she lost Rx they gave her.  She is asking for another Rx.   Hx of irregular cycles, last one 3 months ago.    Past Medical History  Diagnosis Date  . Obesity   . 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 ADHD (attention deficit hyperactivity disorder) 04/17/2013    Past Surgical History  Procedure Laterality Date  . No past surgeries    . Cesarean section N/A 03/15/2013    Procedure: CESAREAN SECTION;  Surgeon: Catalina Antigua, MD;  Location: [facility];  Service: Obstetrics;  Laterality: N/A;  . Cholecystectomy N/A 04/16/2013    Procedure: LAPAROSCOPIC CHOLECYSTECTOMY WITH INTRAOPERATIVE CHOLANGIOGRAM;  Surgeon: Adolph Pollack, MD;  Location: [facility];  Service: General;  Laterality: N/A;    Family History  Problem Relation Age of Onset  . Adopted: Yes  . Drug abuse Mother   . Drug abuse Father     Social History  Substance Use Topics  . Smoking status: Former Smoker    Types: Cigarettes  . Smokeless tobacco: Never Used  . Alcohol Use: No     Comment: "maybe monthly"    Allergies: No Known Allergies  Prescriptions prior to admission  Medication Sig Dispense Refill Last Dose  . bacitracin ointment Apply 1 application topically 2 (two) times daily. Apply to abrasions twice daily. (Patient not taking: Reported on 07/18/2014) 120 g 0 Not  Taking at Unknown time  . oxyCODONE-acetaminophen (PERCOCET) 5-325 MG per tablet Take 1 tablet by mouth every 6 (six) hours as needed for moderate pain. (Patient not taking: Reported on 07/18/2014) 25 tablet 0 Not Taking at Unknown time  . phenazopyridine (PYRIDIUM) 200 MG tablet Take 1 tablet (200 mg total) by mouth 3 (three) times daily. (Patient not taking: Reported on 11/25/2014) 6 tablet 0 Completed Course at Unknown time    Review of Systems  Constitutional: Negative for fever and chills.  Eyes: Negative for double vision.  Respiratory: Negative for shortness of breath.   Gastrointestinal: Positive for nausea and abdominal pain. Negative for vomiting.  Genitourinary: Negative for dysuria, urgency, frequency and hematuria.       Discomfort over the mid lower abdomen, denies dysuria.  Neg for vaginal bleeding or discharge.   Neurological: Positive for headaches.   Physical Exam   Blood pressure 113/70, pulse 83, temperature 98 F (36.7 C), temperature source Oral, resp. rate 18, height  (1.626 m), weight 180 lb (81.647 kg), not currently breastfeeding.  Physical Exam  Constitutional: She is oriented to person, place, and time. She appears well-developed and well-nourished.  HENT:  Head: Normocephalic.  Neck: Normal range of motion. Neck supple.  Cardiovascular: Normal rate, regular rhythm and normal heart sounds.   Respiratory: Effort normal and breath sounds normal. No respiratory distress.  GI: Soft.  There is no tenderness.  Genitourinary: There is no rash, tenderness or lesion on the right labia. There is no rash, tenderness or lesion on the left labia. Uterus is tender (mid lower abdominal tenderness). Uterus is not enlarged. Cervix exhibits no motion tenderness, no discharge and no friability. Right adnexum displays no mass, no tenderness and no fullness. Left adnexum displays no mass, no tenderness and no fullness. No erythema or bleeding in the vagina. Vaginal discharge  (moterate amount of white discharge without odor) found.  Musculoskeletal: Normal range of motion. She exhibits no edema.  Neurological: She is alert and oriented to person, place, and time.  Skin: Skin is warm and dry.  Psychiatric: She has a normal mood and affect. Her behavior is normal.   Results for orders placed or performed during the hospital encounter of 11/25/14 (from the past 24 hour(s))  Urinalysis, Routine w reflex microscopic (not at [facility])     Status: Abnormal   Collection Time: 11/25/14 11:15 AM  Result Value Ref Range   Color, Urine YELLOW YELLOW   APPearance HAZY (A) CLEAR   Specific Gravity, Urine 1.025 1.005 - 1.030   pH 6.0 5.0 - 8.0   Glucose, UA NEGATIVE NEGATIVE mg/dL   Hgb urine dipstick TRACE (A) NEGATIVE   Bilirubin Urine NEGATIVE NEGATIVE   Ketones, ur NEGATIVE NEGATIVE mg/dL   Protein, ur NEGATIVE NEGATIVE mg/dL   Urobilinogen, UA 0.2 0.0 - 1.0 mg/dL   Nitrite NEGATIVE NEGATIVE   Leukocytes, UA MODERATE (A) NEGATIVE  Urine microscopic-add on     Status: Abnormal   Collection Time: 11/25/14 11:15 AM  Result Value Ref Range   Squamous Epithelial / LPF FEW (A) RARE   WBC, UA 11-20 <3 WBC/hpf   RBC / HPF 0-2 <3 RBC/hpf   Bacteria, UA MANY (A) RARE  Pregnancy, urine POC     Status: Abnormal   Collection Time: 11/25/14 11:40 AM  Result Value Ref Range   Preg Test, Ur POSITIVE (A) NEGATIVE  Wet prep, genital     Status: Abnormal   Collection Time: 11/25/14 12:45 PM  Result Value Ref Range   Yeast Wet Prep HPF POC FEW (A) NONE SEEN   Trich, Wet Prep NONE SEEN NONE SEEN   Clue Cells Wet Prep HPF POC MANY (A) NONE SEEN   WBC, Wet Prep HPF POC MANY (A) NONE SEEN  CBC     Status: None   Collection Time: 11/25/14  1:05 PM  Result Value Ref Range   WBC 7.5 4.0 - 10.5 K/uL   RBC 4.32 3.87 - 5.11 MIL/uL   Hemoglobin 12.7 12.0 - 15.0 g/dL   HCT 16.1 09.6 - 04.5 %   MCV 84.7 78.0 - 100.0 fL   MCH 29.4 26.0 - 34.0 pg   MCHC 34.7 30.0 - 36.0 g/dL   RDW 40.9  81.1 - 91.4 %   Platelets 336 150 - 400 K/uL    US Ob Comp Less 14 Wks  11/25/2014   CLINICAL DATA:  Patient with positive pregnancy test. Pelvic and abdominal pain.  EXAM: OBSTETRIC <14 WK [place] AND TRANSVAGINAL OB US  TECHNIQUE: Both transabdominal and transvaginal ultrasound examinations were performed for complete evaluation of the gestation as well as the maternal uterus, adnexal regions, and pelvic cul-de-sac. Transvaginal technique was performed to assess early pregnancy.  COMPARISON:  CT abdomen pelvis 07/04/2014  FINDINGS: Intrauterine gestational sac: Present  Yolk sac:  Present  Embryo:  Not present  MSD: 7  mm  5 w   2  d  Maternal uterus/adnexae: Normal right and left ovaries. Small subchorionic hemorrhage measuring up to 1.4 cm. Trace free fluid in the pelvis.  IMPRESSION: Probable early intrauterine gestational sac and yolk sac but no fetal pole or cardiac activity yet visualized. Recommend follow-up quantitative B-HCG levels and follow-up US in 14 days to confirm and assess viability. This recommendation follows SRU consensus guidelines: Diagnostic Criteria for Nonviable Pregnancy Early in the First Trimester. Malva Limes Med 2013; 161:0960-45.   Electronically Signed   By: Annia Belt M.D.   On: 11/25/2014 13:45   US Ob Transvaginal  11/25/2014   CLINICAL DATA:  Patient with positive pregnancy test. Pelvic and abdominal pain.  EXAM: OBSTETRIC <14 WK [place] AND TRANSVAGINAL OB US  TECHNIQUE: Both transabdominal and transvaginal ultrasound examinations were performed for complete evaluation of the gestation as well as the maternal uterus, adnexal regions, and pelvic cul-de-sac. Transvaginal technique was performed to assess early pregnancy.  COMPARISON:  CT abdomen pelvis 07/04/2014  FINDINGS: Intrauterine gestational sac: Present  Yolk sac:  Present  Embryo:  Not present  MSD: 7  mm   5 w   2  d  Maternal uterus/adnexae: Normal right and left ovaries. Small subchorionic hemorrhage measuring up to 1.4  cm. Trace free fluid in the pelvis.  IMPRESSION: Probable early intrauterine gestational sac and yolk sac but no fetal pole or cardiac activity yet visualized. Recommend follow-up quantitative B-HCG levels and follow-up US in 14 days to confirm and assess viability. This recommendation follows SRU consensus guidelines: Diagnostic Criteria for Nonviable Pregnancy Early in the First Trimester. Malva Limes Med 2013; 409:8119-14.   Electronically Signed   By: Annia Belt M.D.   On: 11/25/2014 13:45   MAU Course  Procedures  Urine culture results pending  MDM MSE Labs EXAM Ultrasound  Assessment and Plan  A:  Pain in early pregnancy      Intrauterine Pregnancy at [redacted]w[redacted]d      Recent dx of UTI without treatment, + hematuria and bacturia on UA       Bacterial Vaginosis  P: Rx for Macrobid and  Flagyl    Strongly encourage patient to complete antibiotics.     Encouraged her to begin prenatal care with MD of her choice     Return for worsening sxs.    KEY,EVE M 11/25/2014, 2:13 PM

## 2014-11-25 NOTE — Discharge Instructions (Signed)
Abdominal Pain During Pregnancy Belly (abdominal) pain is common during pregnancy. Most of the time, it is not a serious problem. Other times, it can be a sign that something is wrong with the pregnancy. Always tell your doctor if you have belly pain. HOME CARE Monitor your belly pain for any changes. The following actions may help you feel better:  Do not have sex (intercourse) or put anything in your vagina until you feel better.  Rest until your pain stops.  Drink clear fluids if you feel sick to your stomach (nauseous). Do not eat solid food until you feel better.  Only take medicine as told by your doctor.  Keep all doctor visits as told. GET HELP RIGHT AWAY IF:   You are bleeding, leaking fluid, or pieces of tissue come out of your vagina.  You have more pain or cramping.  You keep throwing up (vomiting).  You have pain when you pee (urinate) or have blood in your pee.  You have a fever.  You do not feel your baby moving as much.  You feel very weak or feel like passing out.  You have trouble breathing, with or without belly pain.  You have a very bad headache and belly pain.  You have fluid leaking from your vagina and belly pain.  You keep having watery poop (diarrhea).  Your belly pain does not go away after resting, or the pain gets worse. MAKE SURE YOU:   Understand these instructions.  Will watch your condition.  Will get help right away if you are not doing well or get worse. Document Released: 03/11/2009 Document Revised: 11/23/2012 Document Reviewed: 10/20/2012 [facility] Patient Information 2015 [place], [place]. This information is not intended to replace advice given to you by your health care provider. Make sure you discuss any questions you have with your health care provider. Urinary Tract Infection A urinary tract infection (UTI) can occur any place along the urinary tract. The tract includes the kidneys, ureters, bladder, and urethra. A type of  germ called bacteria often causes a UTI. UTIs are often helped with antibiotic medicine.  HOME CARE   If given, take antibiotics as told by your doctor. Finish them even if you start to feel better.  Drink enough fluids to keep your pee (urine) clear or pale yellow.  Avoid tea, drinks with caffeine, and bubbly (carbonated) drinks.  Pee often. Avoid holding your pee in for a long time.  Pee before and after having sex (intercourse).  Wipe from front to back after you poop (bowel movement) if you are a woman. Use each tissue only once. GET HELP RIGHT AWAY IF:   You have back pain.  You have lower belly (abdominal) pain.  You have chills.  You feel sick to your stomach (nauseous).  You throw up (vomit).  Your burning or discomfort with peeing does not go away.  You have a fever.  Your symptoms are not better in 3 days. MAKE SURE YOU:   Understand these instructions.  Will watch your condition.  Will get help right away if you are not doing well or get worse. Document Released: 09/09/2007 Document Revised: 12/16/2011 Document Reviewed: 10/22/2011 [facility] Patient Information 2015 [place], [place]. This information is not intended to replace advice given to you by your health care provider. Make sure you discuss any questions you have with your health care provider. Bacterial Vaginosis Bacterial vaginosis is an infection of the vagina. It happens when too many of certain germs (bacteria) grow  in the vagina. HOME CARE  Take your medicine as told by your doctor.  Finish your medicine even if you start to feel better.  Do not have sex until you finish your medicine and are better.  Tell your sex partner that you have an infection. They should see their doctor for treatment.  Practice safe sex. Use condoms. Have only one sex partner. GET HELP IF:  You are not getting better after 3 days of treatment.  You have more grey fluid (discharge) coming from your vagina than  before.  You have more pain than before.  You have a fever. MAKE SURE YOU:   Understand these instructions.  Will watch your condition.  Will get help right away if you are not doing well or get worse. Document Released: 12/31/2007 Document Revised: 01/11/2013 Document Reviewed: 11/02/2012 [facility] Patient Information 2015 [place], [place]. This information is not intended to replace advice given to you by your health care provider. Make sure you discuss any questions you have with your health care provider.

## 2014-11-25 NOTE — MAU Note (Addendum)
Patient presents pregnant; pregnancy test complete at hospital.  Unsure of weeks gestation. Denies vaginal bleeding. Reports mucous white to clear discharge. Patient reports abdominal pain; intermittent for 2 weeks. Patient dx with UTI 4 days ago; unable to obtain the medication prescribed.

## 2014-11-26 LAB — GC/CHLAMYDIA PROBE AMP (~~LOC~~) NOT AT ARMC
Chlamydia: NEGATIVE
Neisseria Gonorrhea: NEGATIVE

## 2014-11-26 LAB — HIV ANTIBODY (ROUTINE TESTING W REFLEX): HIV Screen 4th Generation wRfx: NONREACTIVE

## 2014-11-27 LAB — CULTURE, OB URINE
Culture: >=100000
Special Requests: NORMAL

## 2014-12-22 ENCOUNTER — Encounter (HOSPITAL_COMMUNITY): Payer: Self-pay | Admitting: *Deleted

## 2014-12-22 ENCOUNTER — Inpatient Hospital Stay (HOSPITAL_COMMUNITY): Payer: Medicaid Other

## 2014-12-22 ENCOUNTER — Inpatient Hospital Stay (HOSPITAL_COMMUNITY)
Admission: AD | Admit: 2014-12-22 | Discharge: 2014-12-22 | Disposition: A | Discharge status: Home / Self Care | Payer: Medicaid Other | Source: Ambulatory Visit | Attending: Obstetrics and Gynecology | Admitting: Obstetrics and Gynecology

## 2014-12-22 DIAGNOSIS — G43909 Migraine, unspecified, not intractable, without status migrainosus: Secondary | ICD-10-CM | POA: Diagnosis not present

## 2014-12-22 DIAGNOSIS — R109 Unspecified abdominal pain: Secondary | ICD-10-CM | POA: Diagnosis present

## 2014-12-22 DIAGNOSIS — G43009 Migraine without aura, not intractable, without status migrainosus: Secondary | ICD-10-CM

## 2014-12-22 DIAGNOSIS — Z9119 Patient's noncompliance with other medical treatment and regimen: Secondary | ICD-10-CM | POA: Insufficient documentation

## 2014-12-22 DIAGNOSIS — Z87891 Personal history of nicotine dependence: Secondary | ICD-10-CM | POA: Diagnosis not present

## 2014-12-22 DIAGNOSIS — O2341 Unspecified infection of urinary tract in pregnancy, first trimester: Secondary | ICD-10-CM | POA: Insufficient documentation

## 2014-12-22 DIAGNOSIS — O26899 Other specified pregnancy related conditions, unspecified trimester: Secondary | ICD-10-CM

## 2014-12-22 DIAGNOSIS — Z3A09 9 weeks gestation of pregnancy: Secondary | ICD-10-CM | POA: Insufficient documentation

## 2014-12-22 LAB — URINALYSIS, ROUTINE W REFLEX MICROSCOPIC
BILIRUBIN URINE: NEGATIVE
GLUCOSE, UA: NEGATIVE mg/dL
Hgb urine dipstick: NEGATIVE
Ketones, ur: NEGATIVE mg/dL
NITRITE: NEGATIVE
PH: 6 (ref 5.0–8.0)
Protein, ur: NEGATIVE mg/dL
SPECIFIC GRAVITY, URINE: 1.025 (ref 1.005–1.030)
Urobilinogen, UA: 0.2 mg/dL (ref 0.0–1.0)

## 2014-12-22 LAB — CBC
HCT: 34.3 % — ABNORMAL LOW (ref 36.0–46.0)
Hemoglobin: 12 g/dL (ref 12.0–15.0)
MCH: 29.4 pg (ref 26.0–34.0)
MCHC: 35 g/dL (ref 30.0–36.0)
MCV: 84.1 fL (ref 78.0–100.0)
PLATELETS: 305 10*3/uL (ref 150–400)
RBC: 4.08 MIL/uL (ref 3.87–5.11)
RDW: 13.8 % (ref 11.5–15.5)
WBC: 11.2 10*3/uL — ABNORMAL HIGH (ref 4.0–10.5)

## 2014-12-22 LAB — HCG, QUANTITATIVE, PREGNANCY: hCG, Beta Chain, Quant, S: 86181 m[IU]/mL — ABNORMAL HIGH (ref <5)

## 2014-12-22 LAB — URINE MICROSCOPIC-ADD ON

## 2014-12-22 LAB — ABO/RH: ABO/RH(D): O NEG

## 2014-12-22 LAB — HIV ANTIBODY (ROUTINE TESTING W REFLEX): HIV Screen 4th Generation wRfx: NONREACTIVE

## 2014-12-22 MED ORDER — BUTALBITAL-APAP-CAFFEINE 50-325-40 MG PO TABS
2.0000 | ORAL_TABLET | Freq: Once | ORAL | Status: AC
  Administered 2014-12-22: 2 via ORAL
  Filled 2014-12-22: qty 2

## 2014-12-22 MED ORDER — CEFTRIAXONE SODIUM 1 G IJ SOLR
1.0000 g | INTRAMUSCULAR | Status: DC
  Administered 2014-12-22: 1 g via INTRAMUSCULAR
  Filled 2014-12-22: qty 10

## 2014-12-22 MED ORDER — CEPHALEXIN 500 MG PO CAPS: 500.0000 mg | ORAL_CAPSULE | Freq: Four times a day (QID) | ORAL | Status: DC

## 2014-12-22 NOTE — MAU Note (Signed)
Pt reports having lower abd pan tonight. Stated she was dx with UTI last week but has not gotten the prescription filled.also c/o a headache.

## 2014-12-22 NOTE — MAU Note (Signed)
Pt has been trying to call someone to pick her up but no one answers. Pt has been sleeping mostly up until now. Will go to lobby and call for someone to pick her up

## 2014-12-22 NOTE — Progress Notes (Signed)
J Rasch NP in to see pt and discuss test results and d/c plan. Written and verbal d/c instructions given and understanding voiced 

## 2014-12-22 NOTE — Progress Notes (Signed)
J Rasch NP notified of pt's u/s results. NP will come d/c pt to home

## 2014-12-22 NOTE — MAU Note (Signed)
Pt to lobby to wait for ride. 

## 2014-12-22 NOTE — MAU Provider Note (Signed)
History     CSN: [id number]  Arrival date and time: 12/22/14 0137   None     Chief Complaint  Patient presents with  . Abdominal Pain  . Headache   HPI   [name] is a 19 y.o. female G2P1001 at [redacted]w[redacted]d presenting to abdominal pain and HA. She has a history of migraine HA's and this HA is similar to that pain. She has not taken anything for pain.  The patient started walking here to the hospital and started having lower abdominal pain and lower back pain so she called 911.   She is having lower abdominal pain; the pain is located in the middle of her stomach and she feels pressure in her vagina. She was diagnosed  with a UTI and BV last month and did not fill the prescriptions and did not take the treatment.  The lower back pain comes and goes and is located on both sides of her back.   The last urine culture grew >=100,000 COLONIES/mL PROTEUS MIRABILIS; the patient was prescribed antibiotics and has not filled the RX  She denies vaginal bleeding.   OB History    Gravida Para Term Preterm AB TAB SAB Ectopic Multiple Living   2 1 1  0 0 0 0 0 0 1      Past Medical History  Diagnosis Date  . Obesity   . 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 ADHD (attention deficit hyperactivity disorder) 04/17/2013  . Medical history non-contributory     Past Surgical History  Procedure Laterality Date  . No past surgeries    . Cesarean section N/A 03/15/2013    Procedure: CESAREAN SECTION;  Surgeon: Catalina Antigua, MD;  Location: [facility];  Service: Obstetrics;  Laterality: N/A;  . Cholecystectomy N/A 04/16/2013    Procedure: LAPAROSCOPIC CHOLECYSTECTOMY WITH INTRAOPERATIVE CHOLANGIOGRAM;  Surgeon: Adolph Pollack, MD;  Location: [facility];  Service: General;  Laterality: N/A;    Family History  Problem Relation Age of Onset  . Adopted: Yes  . Drug abuse Mother   . Drug abuse Father     Social History  Substance Use Topics  . Smoking status:  Former Smoker    Types: Cigarettes  . Smokeless tobacco: Never Used  . Alcohol Use: No     Comment: "maybe monthly"    Allergies: No Known Allergies  Prescriptions prior to admission  Medication Sig Dispense Refill Last Dose  . metroNIDAZOLE (FLAGYL) 500 MG tablet Take 1 tablet (500 mg total) by mouth 2 (two) times daily. 14 tablet 0   . nitrofurantoin, macrocrystal-monohydrate, (MACROBID) 100 MG capsule Take 1 capsule (100 mg total) by mouth 2 (two) times daily. 14 capsule 0    Results for orders placed or performed during the hospital encounter of 12/22/14 (from the past 48 hour(s))  Urinalysis, Routine w reflex microscopic (not at [facility])     Status: Abnormal   Collection Time: 12/22/14  1:40 AM  Result Value Ref Range   Color, Urine YELLOW YELLOW   APPearance CLEAR CLEAR   Specific Gravity, Urine 1.025 1.005 - 1.030   pH 6.0 5.0 - 8.0   Glucose, UA NEGATIVE NEGATIVE mg/dL   Hgb urine dipstick NEGATIVE NEGATIVE   Bilirubin Urine NEGATIVE NEGATIVE   Ketones, ur NEGATIVE NEGATIVE mg/dL   Protein, ur NEGATIVE NEGATIVE mg/dL   Urobilinogen, UA 0.2 0.0 - 1.0 mg/dL   Nitrite NEGATIVE NEGATIVE   Leukocytes, UA SMALL (A) NEGATIVE  Urine microscopic-add on  Status: Abnormal   Collection Time: 12/22/14  1:40 AM  Result Value Ref Range   Squamous Epithelial / LPF RARE RARE   WBC, UA 11-20 <3 WBC/hpf   RBC / HPF 3-6 <3 RBC/hpf   Bacteria, UA MANY (A) RARE  CBC     Status: Abnormal   Collection Time: 12/22/14  2:10 AM  Result Value Ref Range   WBC 11.2 (H) 4.0 - 10.5 K/uL   RBC 4.08 3.87 - 5.11 MIL/uL   Hemoglobin 12.0 12.0 - 15.0 g/dL   HCT 16.1 (L) 09.6 - 04.5 %   MCV 84.1 78.0 - 100.0 fL   MCH 29.4 26.0 - 34.0 pg   MCHC 35.0 30.0 - 36.0 g/dL   RDW 40.9 81.1 - 91.4 %   Platelets 305 150 - 400 K/uL  ABO/Rh     Status: None   Collection Time: 12/22/14  2:10 AM  Result Value Ref Range   ABO/RH(D) O NEG   hCG, quantitative, pregnancy     Status: Abnormal   Collection Time:  12/22/14  2:10 AM  Result Value Ref Range   hCG, Beta Chain, Quant, S 86181 (H) <5 mIU/mL    Comment:          GEST. AGE      CONC.  (mIU/mL)   <=1 WEEK        5 - 50     2 WEEKS       50 - 500     3 WEEKS       100 - 10,000     4 WEEKS     1,000 - 30,000     5 WEEKS     3,500 - 115,000   6-8 WEEKS     12,000 - 270,000    12 WEEKS     15,000 - 220,000        FEMALE AND NON-PREGNANT FEMALE:     LESS THAN 5 mIU/mL    US Ob Transvaginal  12/22/2014   CLINICAL DATA:  Pregnant patient with pelvic pain.  EXAM: TRANSVAGINAL OB ULTRASOUND  TECHNIQUE: Transvaginal ultrasound was performed for complete evaluation of the gestation as well as the maternal uterus, adnexal regions, and pelvic cul-de-sac.  COMPARISON:  11/25/2014  FINDINGS: Intrauterine gestational sac: Visualized/normal in shape.  Yolk sac:  Present.  Embryo:  Present.  Cardiac Activity: Present.  Heart Rate: 168 bpm  CRL:   23.8  mm   9 w 1 d                  [place] EDC: 07/26/2015  Maternal uterus/adnexae: No subchorionic hemorrhage. Neither ovary was discretely visualized, patient did not tolerate adnexal evaluation. No pelvic free fluid.  IMPRESSION: Single live intrauterine pregnancy estimated gestational age [redacted] weeks 1 day for estimated date of delivery 07/26/2015.   Electronically Signed   By: Rubye Oaks M.D.   On: 12/22/2014 04:14    Review of Systems  Constitutional: Negative for fever and chills.  Eyes: Positive for photophobia.  Gastrointestinal: Positive for nausea and vomiting.  Genitourinary: Positive for dysuria.  Musculoskeletal: Positive for back pain.  Neurological: Positive for headaches.   Physical Exam   Blood pressure 93/49, pulse 77, temperature 98.1 F (36.7 C), temperature source Oral, resp. rate 18, height 5\' 5"  (1.651 m), weight 84.369 kg (186 lb), not currently breastfeeding.  Physical Exam  Constitutional: She is oriented to person, place, and time. She appears well-developed and well-nourished. No  distress.  HENT:  Head: Normocephalic.  GI: Soft. Normal appearance. There is generalized tenderness. There is no CVA tenderness.  Musculoskeletal: Normal range of motion.  Neurological: She is alert and oriented to person, place, and time.  Skin: Skin is warm. She is not diaphoretic.  Psychiatric: Her behavior is normal.    MAU Course  Procedures  None  MDM Rocephin 1 gram given IM Fioricet 2 tabs given   [place]. Patient sleeping in the bed and currently rates her pain 3/10 at the time of discharge.   Assessment and Plan   A:  SIUP @ [redacted]w[redacted]d UTI ; non compliance with treatment  Migraine in pregnancy; resolved with Fioricet   P:  Discharge home in stable condition  Discussed the important of taking her prescribed antibiotics for UTI  Ok to take tylenol for HA  Return to MAU if symptoms worsen  Patient is O negative and will need to come to MAU immediately with any vaginal bleeding.    Duane Lope, NP 12/22/2014 2:03 AM

## 2014-12-22 NOTE — Discharge Instructions (Signed)

## 2015-01-03 ENCOUNTER — Encounter (HOSPITAL_COMMUNITY): Payer: Self-pay | Admitting: Emergency Medicine

## 2015-01-03 ENCOUNTER — Emergency Department (HOSPITAL_COMMUNITY)
Admission: EM | Admit: 2015-01-03 | Discharge: 2015-01-03 | Disposition: A | Discharge status: Home / Self Care | Payer: Medicaid Other | Attending: Emergency Medicine | Admitting: Emergency Medicine

## 2015-01-03 DIAGNOSIS — O99281 Endocrine, nutritional and metabolic diseases complicating pregnancy, first trimester: Secondary | ICD-10-CM | POA: Diagnosis not present

## 2015-01-03 DIAGNOSIS — E669 Obesity, unspecified: Secondary | ICD-10-CM | POA: Insufficient documentation

## 2015-01-03 DIAGNOSIS — R110 Nausea: Secondary | ICD-10-CM

## 2015-01-03 DIAGNOSIS — Z3A12 12 weeks gestation of pregnancy: Secondary | ICD-10-CM | POA: Diagnosis not present

## 2015-01-03 DIAGNOSIS — O210 Mild hyperemesis gravidarum: Secondary | ICD-10-CM | POA: Insufficient documentation

## 2015-01-03 DIAGNOSIS — O9989 Other specified diseases and conditions complicating pregnancy, childbirth and the puerperium: Secondary | ICD-10-CM | POA: Insufficient documentation

## 2015-01-03 DIAGNOSIS — Z8659 Personal history of other mental and behavioral disorders: Secondary | ICD-10-CM | POA: Insufficient documentation

## 2015-01-03 DIAGNOSIS — R059 Cough, unspecified: Secondary | ICD-10-CM

## 2015-01-03 DIAGNOSIS — R5383 Other fatigue: Secondary | ICD-10-CM | POA: Diagnosis not present

## 2015-01-03 DIAGNOSIS — R51 Headache: Secondary | ICD-10-CM | POA: Insufficient documentation

## 2015-01-03 DIAGNOSIS — Z87891 Personal history of nicotine dependence: Secondary | ICD-10-CM | POA: Diagnosis not present

## 2015-01-03 DIAGNOSIS — R519 Headache, unspecified: Secondary | ICD-10-CM

## 2015-01-03 DIAGNOSIS — R05 Cough: Secondary | ICD-10-CM

## 2015-01-03 DIAGNOSIS — Z792 Long term (current) use of antibiotics: Secondary | ICD-10-CM | POA: Diagnosis not present

## 2015-01-03 LAB — COMPREHENSIVE METABOLIC PANEL
ALT: 18 U/L (ref 14–54)
AST: 22 U/L (ref 15–41)
Albumin: 3.6 g/dL (ref 3.5–5.0)
Alkaline Phosphatase: 76 U/L (ref 38–126)
Anion gap: 3 — ABNORMAL LOW (ref 5–15)
BUN: 6 mg/dL (ref 6–20)
CO2: 22 mmol/L (ref 22–32)
Calcium: 8.8 mg/dL — ABNORMAL LOW (ref 8.9–10.3)
Chloride: 107 mmol/L (ref 101–111)
Creatinine, Ser: 0.66 mg/dL (ref 0.44–1.00)
GFR calc Af Amer: >60 mL/min (ref >60)
GFR calc non Af Amer: >60 mL/min (ref >60)
Glucose, Bld: 90 mg/dL (ref 65–99)
Potassium: 3.5 mmol/L (ref 3.5–5.1)
Sodium: 132 mmol/L — ABNORMAL LOW (ref 135–145)
Total Bilirubin: 0.4 mg/dL (ref 0.3–1.2)
Total Protein: 6.8 g/dL (ref 6.5–8.1)

## 2015-01-03 LAB — URINALYSIS, ROUTINE W REFLEX MICROSCOPIC
Bilirubin Urine: NEGATIVE
Glucose, UA: NEGATIVE mg/dL
Hgb urine dipstick: NEGATIVE
Ketones, ur: NEGATIVE mg/dL
Leukocytes, UA: NEGATIVE
Nitrite: NEGATIVE
Protein, ur: NEGATIVE mg/dL
Specific Gravity, Urine: 1.007 (ref 1.005–1.030)
Urobilinogen, UA: 0.2 mg/dL (ref 0.0–1.0)
pH: 7.5 (ref 5.0–8.0)

## 2015-01-03 LAB — CBC
HCT: 36.4 % (ref 36.0–46.0)
Hemoglobin: 12.9 g/dL (ref 12.0–15.0)
MCH: 30.2 pg (ref 26.0–34.0)
MCHC: 35.4 g/dL (ref 30.0–36.0)
MCV: 85.2 fL (ref 78.0–100.0)
Platelets: 304 10*3/uL (ref 150–400)
RBC: 4.27 MIL/uL (ref 3.87–5.11)
RDW: 13.5 % (ref 11.5–15.5)
WBC: 8.9 10*3/uL (ref 4.0–10.5)

## 2015-01-03 LAB — LIPASE, BLOOD: Lipase: 18 U/L — ABNORMAL LOW (ref 22–51)

## 2015-01-03 LAB — RAPID STREP SCREEN (MED CTR MEBANE ONLY): Streptococcus, Group A Screen (Direct): NEGATIVE

## 2015-01-03 MED ORDER — ACETAMINOPHEN 500 MG PO TABS: 500.0000 mg | ORAL_TABLET | Freq: Four times a day (QID) | ORAL | Status: DC | PRN

## 2015-01-03 MED ORDER — SODIUM CHLORIDE 0.9 % IV BOLUS (SEPSIS)
1000.0000 mL | Freq: Once | INTRAVENOUS | Status: AC
Start: 2015-01-03 — End: 2015-01-03
  Administered 2015-01-03: 1000 mL via INTRAVENOUS

## 2015-01-03 MED ORDER — ACETAMINOPHEN 325 MG PO TABS
650.0000 mg | ORAL_TABLET | Freq: Once | ORAL | Status: AC
  Administered 2015-01-03: 650 mg via ORAL
  Filled 2015-01-03: qty 2

## 2015-01-03 NOTE — ED Notes (Addendum)
Pt is aware of urine sample state she have yet had anything to drink and do not have to go right now.

## 2015-01-03 NOTE — ED Notes (Signed)
Pt states she's had a worsening cough, SOB, weakness on exertion, feeling faint/lightheaded with a headache. Pt is pregnant x 3 months. States she's very nauseous, states she vomits "but it's straight mucus."

## 2015-01-03 NOTE — ED Provider Notes (Signed)
CSN: [id number]     Arrival date & time 01/03/15  1424 History   First MD Initiated Contact with Patient 01/03/15 1458     Chief Complaint  Patient presents with  . Cough  . Headache  . Dizziness    HPI   Morgan Lewis is a 19 y.o. female who presents to the ED with headache, cough, and N/V. She states she is 3 months pregnant. She reports she had an [place] one month ago, but has not had any other prenatal care. She reports her symptoms have been constant for the last 4 days. She denies exacerbating factors. She has not tried anything for symptom relief. She states she has a headache to the front of her head, and reports she feels lightheaded, which she attributes to not having had anything to eat or drink since the onset of her symptoms. She reports cough productive of white mucous and sore throat. She denies nasal congestion. She states anytime she tries to eat or drink anything, she feels nausea and vomits "mucous." She denies chest pain. She reports shortness of breath with coughing. She denies abdominal pain, diarrhea, constipation, dysuria, urgency, frequency, vaginal discharge, vaginal bleeding.    Past Medical History  Diagnosis Date  . Obesity   . 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 ADHD (attention deficit hyperactivity disorder) 04/17/2013  . Medical history non-contributory    Past Surgical History  Procedure Laterality Date  . No past surgeries    . Cesarean section N/A 03/15/2013    Procedure: CESAREAN SECTION;  Surgeon: Catalina Antigua, MD;  Location: [facility];  Service: Obstetrics;  Laterality: N/A;  . Cholecystectomy N/A 04/16/2013    Procedure: LAPAROSCOPIC CHOLECYSTECTOMY WITH INTRAOPERATIVE CHOLANGIOGRAM;  Surgeon: Adolph Pollack, MD;  Location: [facility];  Service: General;  Laterality: N/A;   Family History  Problem Relation Age of Onset  . Adopted: Yes  . Drug abuse Mother   . Drug abuse Father    Social History  Substance Use  Topics  . Smoking status: Former Smoker    Types: Cigarettes  . Smokeless tobacco: Never Used  . Alcohol Use: No     Comment: "maybe monthly"   OB History    Gravida Para Term Preterm AB TAB SAB Ectopic Multiple Living   0 0 0 0 0 0 1      Review of Systems  Constitutional: Positive for fatigue. Negative for fever and chills.  HENT: Positive for sore throat. Negative for congestion.   Eyes: Negative for visual disturbance.  Respiratory: Positive for cough and shortness of breath.   Cardiovascular: Negative for chest pain.  Gastrointestinal: Positive for nausea and vomiting. Negative for abdominal pain, diarrhea and constipation.  Genitourinary: Negative for dysuria, urgency, frequency, vaginal bleeding, vaginal discharge, vaginal pain and pelvic pain.  Neurological: Positive for light-headedness and headaches. Negative for dizziness, syncope, weakness and numbness.  All other systems reviewed and are negative.     Allergies  Review of patient's allergies indicates no known allergies.  Home Medications   Prior to Admission medications   Medication Sig Start Date End Date Taking? Authorizing Provider  cephALEXin (KEFLEX) 500 MG capsule Take 1 capsule (500 mg total) by mouth 4 (four) times daily. 12/22/14   Duane Lope, NP  metroNIDAZOLE (FLAGYL) 500 MG tablet Take 1 tablet (500 mg total) by mouth 2 (two) times daily. 11/25/14   Elta Guadeloupe, NP    BP 106/65 mmHg  Pulse 82  Temp(Src) 98.1 F (36.7 C) (Oral)  Resp 20  Ht  (1.651 m)  Wt 180 lb (81.647 kg)  BMI 29.95 kg/m2  SpO2 98%  LMP  (LMP Unknown) Physical Exam  Constitutional: She is oriented to person, place, and time. She appears well-developed and well-nourished. No distress.  HENT:  Head: Normocephalic and atraumatic.  Right Ear: Tympanic membrane, external ear and ear canal normal.  Left Ear: Tympanic membrane, external ear and ear canal normal.  Nose: Nose normal.  Mouth/Throat: Uvula is  midline, oropharynx is clear and moist and mucous membranes are normal. No oropharyngeal exudate.  Eyes: Conjunctivae, EOM and lids are normal. Pupils are equal, round, and reactive to light. Right eye exhibits no discharge. Left eye exhibits no discharge. No scleral icterus.  Neck: Normal range of motion. Neck supple.  Cardiovascular: Normal rate, regular rhythm, normal heart sounds, intact distal pulses and normal pulses.   Pulmonary/Chest: Effort normal and breath sounds normal. No respiratory distress. She has no wheezes. She has no rales.  Abdominal: Soft. Normal appearance and bowel sounds are normal. She exhibits no distension and no mass. There is no tenderness. There is no rigidity, no rebound and no guarding.  Musculoskeletal: Normal range of motion. She exhibits no edema or tenderness.  Neurological: She is alert and oriented to person, place, and time. She has normal strength. No cranial nerve deficit or sensory deficit.  Skin: Skin is warm, dry and intact. No rash noted. She is not diaphoretic. No erythema. No pallor.  Psychiatric: She has a normal mood and affect. Her speech is normal and behavior is normal. Judgment and thought content normal.  Nursing note and vitals reviewed.   ED Course  Procedures (including critical care time)  Labs Review Labs Reviewed  LIPASE, BLOOD - Abnormal; Notable for the following:    Lipase 18 (*)    All other components within normal limits  COMPREHENSIVE METABOLIC PANEL - Abnormal; Notable for the following:    Sodium 132 (*)    Calcium 8.8 (*)    Anion gap 3 (*)    All other components within normal limits  RAPID STREP SCREEN (NOT AT [facility])  CULTURE, GROUP A STREP  CBC  URINALYSIS, ROUTINE W REFLEX MICROSCOPIC (NOT AT [facility])    Imaging Review No results found.   I have personally reviewed and evaluated these images and lab results as part of my medical decision-making.   EKG Interpretation None      MDM   Final diagnoses:   Nausea  Cough  Headache, unspecified headache type   19 year old female presents with headache, cough productive of white mucus, nausea, and vomiting "mucus" for 4 days. [place] 12/22/14 demonstrates single live intrauterine pregnancy estimated gestational age [redacted] weeks 1 day for estimated date of delivery 07/26/2015.  Patient is afebrile. Vital signs stable. Heart regular rate and rhythm. Lungs clear to auscultation bilaterally. Abdomen soft, nontender, nondistended. No lower extremity edema.  CBC, CMP unremarkable. Lipase within normal limits. Rapid strep negative. UA negative for infection. Patient given fluids and tylenol. Patient reports resolution of headache with tylenol. She states she has a history of migraines, and that this headache is not as severe as her typical headache. Patient is requesting to eat and drink in the ED.  Feel patient is stable for discharge at this time. Patient to follow-up with OB for prenatal care and PCP. Return precautions discussed at length. Patient is in agreement with plan.  BP 97/61 mmHg  Pulse 62  Temp(Src) 98 F (36.7 C) (Oral)  Resp 14  Ht  (1.651 m)  Wt 180 lb (81.647 kg)  BMI 29.95 kg/m2  SpO2 100%  LMP  (LMP Unknown)      Mady Gemma, PA-C 01/04/15 0144  Raeford Razor, MD 01/04/15 [phone]

## 2015-01-03 NOTE — Discharge Instructions (Signed)
1. Medications: tylenol, usual home medications 2. Treatment: rest, drink plenty of fluids 3. Follow Up: please followup with your primary doctor within the next week for discussion of your diagnoses and further evaluation after today's visit; if you do not have a primary care doctor use the resource guide provided to find one; please return to the ER for severe headache, loss of consciousness, chest pain, shortness of breath, abdominal pain, vaginal discharge or bleeding, new or worsening symptoms   Cough, Adult  A cough is a reflex. It helps you clear your throat and airways. A cough can help heal your body. A cough can last 2 or 3 weeks (acute) or may last more than 8 weeks (chronic). Some common causes of a cough can include an infection, allergy, or a cold. HOME CARE  Only take medicine as told by your doctor.  If given, take your medicines (antibiotics) as told. Finish them even if you start to feel better.  Use a cold steam vaporizer or humidifier in your home. This can help loosen thick spit (secretions).  Sleep so you are almost sitting up (semi-upright). Use pillows to do this. This helps reduce coughing.  Rest as needed.  Stop smoking if you smoke. GET HELP RIGHT AWAY IF:  You have yellowish-white fluid (pus) in your thick spit.  Your cough gets worse.  Your medicine does not reduce coughing, and you are losing sleep.  You cough up blood.  You have trouble breathing.  Your pain gets worse and medicine does not help.  You have a fever. MAKE SURE YOU:   Understand these instructions.  Will watch your condition.  Will get help right away if you are not doing well or get worse. Document Released: 12/04/2010 Document Revised: 08/07/2013 Document Reviewed: 12/04/2010 [facility] Patient Information 2015 [place], [place]. This information is not intended to replace advice given to you by your health care provider. Make sure you discuss any questions you have with your  health care provider.  General Headache Without Cause A general headache is pain or discomfort felt around the head or neck area. The cause may not be found.  HOME CARE   Keep all doctor visits.  Only take medicines as told by your doctor.  Lie down in a dark, quiet room when you have a headache.  Keep a journal to find out if certain things bring on headaches. For example, write down:  What you eat and drink.  How much sleep you get.  Any change to your diet or medicines.  Relax by getting a massage or doing other relaxing activities.  Put ice or heat packs on the head and neck area as told by your doctor.  Lessen stress.  Sit up straight. Do not tighten (tense) your muscles.  Quit smoking if you smoke.  Lessen how much alcohol you drink.  Lessen how much caffeine you drink, or stop drinking caffeine.  Eat and sleep on a regular schedule.  Get 7 to 9 hours of sleep, or as told by your doctor.  Keep lights dim if bright lights bother you or make your headaches worse. GET HELP RIGHT AWAY IF:   Your headache becomes really bad.  You have a fever.  You have a stiff neck.  You have trouble seeing.  Your muscles are weak, or you lose muscle control.  You lose your balance or have trouble walking.  You feel like you will pass out (faint), or you pass out.  You have really bad  symptoms that are different than your first symptoms.  You have problems with the medicines given to you by your doctor.  Your medicines do not work.  Your headache feels different than the other headaches.  You feel sick to your stomach (nauseous) or throw up (vomit). MAKE SURE YOU:   Understand these instructions.  Will watch your condition.  Will get help right away if you are not doing well or get worse. Document Released: 12/31/2007 Document Revised: 06/15/2011 Document Reviewed: 03/13/2011 [facility] Patient Information 2015 [place], [place]. This information is not intended  to replace advice given to you by your health care provider. Make sure you discuss any questions you have with your health care provider.  Nausea and Vomiting Nausea means you feel sick to your stomach. Throwing up (vomiting) is a reflex where stomach contents come out of your mouth. HOME CARE   Take medicine as told by your doctor.  Do not force yourself to eat. However, you do need to drink fluids.  If you feel like eating, eat a normal diet as told by your doctor.  Eat rice, wheat, potatoes, bread, lean meats, yogurt, fruits, and vegetables.  Avoid high-fat foods.  Drink enough fluids to keep your pee (urine) clear or pale yellow.  Ask your doctor how to replace body fluid losses (rehydrate). Signs of body fluid loss (dehydration) include:  Feeling very thirsty.  Dry lips and mouth.  Feeling dizzy.  Dark pee.  Peeing less than normal.  Feeling confused.  Fast breathing or heart rate. GET HELP RIGHT AWAY IF:   You have blood in your throw up.  You have black or bloody poop (stool).  You have a bad headache or stiff neck.  You feel confused.  You have bad belly (abdominal) pain.  You have chest pain or trouble breathing.  You do not pee at least once every 8 hours.  You have cold, clammy skin.  You keep throwing up after 24 to 48 hours.  You have a fever. MAKE SURE YOU:   Understand these instructions.  Will watch your condition.  Will get help right away if you are not doing well or get worse. Document Released: 09/09/2007 Document Revised: 06/15/2011 Document Reviewed: 08/22/2010 [facility] Patient Information 2015 [place], [place]. This information is not intended to replace advice given to you by your health care provider. Make sure you discuss any questions you have with your health care provider.   Emergency Department Resource Guide 1) Find a Doctor and Pay Out of Pocket Although you won't have to find out who is covered by your insurance plan,  it is a good idea to ask around and get recommendations. You will then need to call the office and see if the doctor you have chosen will accept you as a new patient and what types of options they offer for patients who are self-pay. Some doctors offer discounts or will set up payment plans for their patients who do not have insurance, but you will need to ask so you aren't surprised when you get to your appointment.  2) Contact Your Local Health Department Not all health departments have doctors that can see patients for sick visits, but many do, so it is worth a call to see if yours does. If you don't know where your local health department is, you can check in your phone book. The CDC also has a tool to help you locate your state's health department, and many state websites also have listings of  all of their local health departments.  3) Find a Walk-in Clinic If your illness is not likely to be very severe or complicated, you may want to try a walk in clinic. These are popping up all over the country in pharmacies, drugstores, and shopping centers. They're usually staffed by nurse practitioners or physician assistants that have been trained to treat common illnesses and complaints. They're usually fairly quick and inexpensive. However, if you have serious medical issues or chronic medical problems, these are probably not your best option.  No Primary Care Doctor: - Call Health Connect at  [phone] - they can help you locate a primary care doctor that  accepts your insurance, provides certain services, etc. - Physician Referral Service- [phone]  Chronic Pain Problems: Organization         Address  Phone   Notes  [facility]  [phone] Patients need to be referred by their primary care doctor.   Medication Assistance: Organization         Address  Phone   Notes  [facility] Medication [facility] [address] [place] [place], [place] [address] [phone] --Must be a resident of [facility] -- Must have NO insurance coverage whatsoever (no Medicaid/ Medicare, etc.) -- The pt. MUST have a primary care doctor that directs their care regularly and follows them in the community   Med[facility]ist  [phone]   [employer]  [phone]    Agencies that provide inexpensive medical care: Organization         Address  Phone   Notes  [facility]  [phone]   [facility]    [phone]   [facility] [address] [place], [place] [address] [phone]   [facility] [address] [place] [place] [phone]   Planned Parenthood    [phone]   [facility]    [phone]   Community Health and [facility]  [address], [place] Phone:  [phone], Fax:  [phone] Hours of Operation:  9 am - 6 pm, M-F.  Also accepts Medicaid/Medicare and self-pay.  [facility]  [address], [place] Phone: [phone], Fax: [phone]. Hours of Operation:  8:30 am - 5:30 pm, M-F.  Also accepts Medicaid and self-pay.  [facility] [place] [address] [place] Phone: [phone]   [facility] [address] Natasha Bence [place], [place] [phone], Ext. [phone] Mondays & Thursdays: 7-9 AM.  First 15 patients are seen on a first come, first serve basis.    Medicaid-accepting [facility] Providers:  Organization         Address  Phone   Notes  [facility] [address], [place] [phone] Also accepts self-pay patients.  [facility] [address] Laurell Josephs [place], [place]  [phone]   [facility] [address], [place] [phone]   [facility] [address], [place] [phone]   Renaye Rakers [address], [place]   [phone] Only accepts [place] Access [place] patients after they have their name applied to their card.   Self-Pay (no insurance) in [facility]:  Retail buyer   Notes  Sickle Cell Patients, [facility] Internal Medicine [address] [place], [place] [phone]   [facility]gent Care [address] [place], [place] [phone]   [facility] [place]  [address], [place] [phone]   [facility]/Dr. Osei-Bonsu  [address] [place] or [address], [place] [phone] Phone number for both [place] and [place] locations is the same.  [facility] [address], [place] [phone]   [facility] [address], [place] or [address] [phone]   [facility] [address] [place] [phone], phone; [phone], fax Sees patients 1st and 3rd Saturday of every month.  Must not qualify for public or private insurance (i.e. Medicaid, Medicare, [place] Health Choice, Veterans' Benefits)  Household income should be no more than 200% of the poverty level The clinic cannot treat you if you are pregnant or think you are pregnant  Sexually transmitted diseases are not treated at the clinic.    Dental Care: Organization         Address  Phone  Notes  [facility] Department of [facility] [address] [place], [place] [phone] Accepts children up to age 25 who are enrolled in [place] or [place] Health Choice; pregnant women with a Medicaid card; and children who have applied for Medicaid or [place] Health Choice, but were declined, whose parents can pay a reduced fee at time of service.  [facility] of [facility]  [address], [place] [phone] Accepts children up to age 16 who are enrolled in [place] or [facility]  Choice; pregnant women with a Medicaid card; and children who have applied for Medicaid or [place] Health Choice, but were declined, whose parents can pay a reduced fee at time of service.  [facility]  [address] [place], [place] [phone] Patients are seen by appointment only. Walk-ins are not accepted. [facility] will see patients 24 years of age and older. Monday - Tuesday (8am-5pm) Most Wednesdays (8:30-5pm) $30 per visit, cash only  [facility] Adult [facility] Access [facility]  [address], [facility] [phone] Patients are seen by appointment only. Walk-ins are not accepted. [facility] will see patients 22 years of age and older. One Wednesday Evening (Monthly: Volunteer Based).  $30 per visit, cash only  [employer]  [phone] for adults; Children under age 47, call Graduate Pediatric Dentistry at [phone]. Children aged 33-14, please call [phone] to request a pediatric application.  Dental services are provided in all areas of dental care including fillings, crowns and bridges, complete and partial dentures, implants, gum treatment, root canals, and extractions. Preventive care is also provided. Treatment is provided to both adults and children. Patients are selected via a lottery and there is often a waiting list.   [facility] [address] [place]  [phone] [email or web address]   [facility] [address] [place], [place] [phone], Ext. [phone] Second and Fourth Thursday of each month, opens at 6:30 AM; Clinic ends at 9 AM.  Patients are seen on a first-come first-served basis, and a limited number are seen during each clinic.   [facility]  [address] Ether Griffins [place], [place] [phone]   Eligibility Requirements  You must have lived in [place], [place], or [place] counties for at least the last three months.   You cannot be eligible for state or  federal sponsored [place], including [employer], [place], or [employer].   You generally cannot be eligible for healthcare insurance through your employer.    How to apply: Eligibility screenings are held every Tuesday and Wednesday afternoon from 1:00 pm until 4:00 pm. You do not need an appointment for the interview!  [facility] [address], [place], [place] [phone]   [facility] Health Department  [phone]   [facility] Health Department  [phone]   [facility] Health Department  [phone]    Behavioral Health Resources in the Community: Intensive Outpatient Programs Organization         Address  Phone  Notes  [facility] Services [address] [place], [place] [phone]   [facility] Outpatient [address], [place], [place] [phone]   [facility] [address] [place], [place]  [phone]   [facility] Mental Health [address]  [place], [place] [phone] or [phone]   Substance Abuse Resources Organization         Address  Phone  Notes  [facility] Drug Services  [phone]   [facility]  [phone]   [facility]  [phone]   Floydene Flock  [phone]   Residential & Outpatient Substance Abuse Program  [phone]   Psychological Services Organization         Address  Phone  Notes  [facility] Health  [phone]   [facility] Services  [phone]   [facility] Mental Health [address] [place] [phone] or [phone]    Mobile Crisis Teams Organization         Address  Phone  Notes  [facility], Mobile Crisis Care Unit  [phone]   [facility]  [address] [place], [place] [phone]   Doristine Locks [address] [place] [phone]    Self-Help/Support Groups Organization         Address  Phone              Notes  [facility] [place] - variety of support groups  336- I7437963 Call for more information  Narcotics Anonymous (NA), [facility] [address], [employer] [place]  2 meetings at this location   Statistician         Address  Phone  Notes  [facility] [address],    [place]  [phone]   [facility]  [address] [place] [address], [place], [place] [phone]   [facility] Treatment Facility [address] [place], [place] [phone] Admissions: 8am-3pm M-F  [facility] [address]    [place], [place] [phone]   [facility] [address] Starling Manns [place], [place] [phone]   [facility] [address],  [place], [place] [phone]   [facility] - Intensive Outpatient [address] Laurell Josephs [address], [place], [place] [phone]   [facility] (Addiction Recovery Care [facility] [address] [place]  [place], [place] [phone] or [phone]   [facility] ([facility]) [address], [place], [place] [phone] Accepts Medicaid  [facility] [place] [address]  [place] [phone] Substance Abuse/Addiction Treatment   [facility] Resources Organization  Address  Phone  Notes  [facility]  [phone]   Angie Fava, PhD [address] Ervin Knack [place], [place]   [phone] or [phone]   [facility] Behavioral   [address] [place], [place] [phone]   [facility] Recovery [address] [place], [place] [phone] Insurance/Medicaid/sponsorship through [facility] and Families [address]                                    [place], [place] [phone] Therapy/tele-psych/case  [facility] [address], [place] [phone]    Dr. Lolly Mustache  [phone]   [facility]  [facility]  Dept. 1) [address]  2) [address], [place] 3)  [address], [place] [phone]  [phone]   [facility] Child Abuse Hotline 907-063-6279 or [phone] (After Hours)

## 2015-01-05 LAB — CULTURE, GROUP A STREP: Strep A Culture: NEGATIVE

## 2015-02-27 ENCOUNTER — Encounter: Payer: Self-pay | Admitting: Certified Nurse Midwife

## 2015-03-08 ENCOUNTER — Other Ambulatory Visit: Payer: Self-pay | Admitting: Certified Nurse Midwife

## 2015-09-30 ENCOUNTER — Encounter (HOSPITAL_COMMUNITY): Payer: Self-pay | Admitting: *Deleted

## 2016-05-26 ENCOUNTER — Encounter (HOSPITAL_COMMUNITY): Payer: Self-pay

## 2016-05-26 ENCOUNTER — Emergency Department (HOSPITAL_COMMUNITY)
Admission: EM | Admit: 2016-05-26 | Discharge: 2016-05-27 | Disposition: A | Discharge status: Home / Self Care | Payer: Medicaid Other | Attending: Emergency Medicine | Admitting: Emergency Medicine

## 2016-05-26 DIAGNOSIS — R112 Nausea with vomiting, unspecified: Secondary | ICD-10-CM | POA: Insufficient documentation

## 2016-05-26 DIAGNOSIS — Z87891 Personal history of nicotine dependence: Secondary | ICD-10-CM | POA: Insufficient documentation

## 2016-05-26 DIAGNOSIS — R197 Diarrhea, unspecified: Secondary | ICD-10-CM | POA: Insufficient documentation

## 2016-05-26 LAB — COMPREHENSIVE METABOLIC PANEL
ALK PHOS: 100 U/L (ref 38–126)
ALT: 36 U/L (ref 14–54)
ANION GAP: 10 (ref 5–15)
AST: 33 U/L (ref 15–41)
Albumin: 4.1 g/dL (ref 3.5–5.0)
BUN: 8 mg/dL (ref 6–20)
CALCIUM: 9.3 mg/dL (ref 8.9–10.3)
CO2: 23 mmol/L (ref 22–32)
Chloride: 107 mmol/L (ref 101–111)
Creatinine, Ser: 0.79 mg/dL (ref 0.44–1.00)
GFR calc non Af Amer: >60 mL/min (ref >60)
Glucose, Bld: 86 mg/dL (ref 65–99)
Potassium: 4.3 mmol/L (ref 3.5–5.1)
SODIUM: 140 mmol/L (ref 135–145)
Total Bilirubin: 1.6 mg/dL — ABNORMAL HIGH (ref 0.3–1.2)
Total Protein: 7.2 g/dL (ref 6.5–8.1)

## 2016-05-26 LAB — CBC
HCT: 41.9 % (ref 36.0–46.0)
HEMOGLOBIN: 14.4 g/dL (ref 12.0–15.0)
MCH: 29.6 pg (ref 26.0–34.0)
MCHC: 34.4 g/dL (ref 30.0–36.0)
MCV: 86.2 fL (ref 78.0–100.0)
Platelets: 372 10*3/uL (ref 150–400)
RBC: 4.86 MIL/uL (ref 3.87–5.11)
RDW: 12.5 % (ref 11.5–15.5)
WBC: 10.7 10*3/uL — ABNORMAL HIGH (ref 4.0–10.5)

## 2016-05-26 LAB — I-STAT BETA HCG BLOOD, ED (MC, WL, AP ONLY)

## 2016-05-26 LAB — LIPASE, BLOOD: LIPASE: 18 U/L (ref 11–51)

## 2016-05-26 MED ORDER — ONDANSETRON 4 MG PO TBDP
ORAL_TABLET | ORAL | Status: AC
  Filled 2016-05-26: qty 1

## 2016-05-26 MED ORDER — ONDANSETRON 4 MG PO TBDP
4.0000 mg | ORAL_TABLET | Freq: Once | ORAL | Status: AC | PRN
  Administered 2016-05-26: 4 mg via ORAL

## 2016-05-26 MED ORDER — ONDANSETRON HCL 4 MG/2ML IJ SOLN
4.0000 mg | Freq: Once | INTRAMUSCULAR | Status: AC
  Administered 2016-05-26: 4 mg via INTRAVENOUS
  Filled 2016-05-26: qty 2

## 2016-05-26 MED ORDER — SODIUM CHLORIDE 0.9 % IV BOLUS (SEPSIS)
1000.0000 mL | Freq: Once | INTRAVENOUS | Status: AC
  Administered 2016-05-26: 1000 mL via INTRAVENOUS

## 2016-05-26 NOTE — ED Provider Notes (Signed)
[facility]-EMERGENCY DEPT Provider Note   CSN: [id number] Arrival date & time: 05/26/16  1858     History   Chief Complaint Chief Complaint  Patient presents with  . Anorexia  . Headache  . Emesis   HPI  Blood pressure 121/76, pulse 90, temperature 98.4 F (36.9 C), temperature source Oral, resp. rate 18, height 5\' 4"  (1.626 m), weight 81.6 kg, SpO2 100 %, unknown if currently breastfeeding.  Morgan Lewis is a 21 y.o. female complaining of Multiple episodes of nonbloody, nonbilious, non-coffee ground emesis with associated diarrhea onset this morning and diffuse moderate abdominal pain with no sick contacts, fever, chills, abnormal vaginal discharge, dysuria, hematuria.  Past Medical History:  Diagnosis Date  . ADHD (attention deficit hyperactivity disorder) 04/17/2013  . 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 Medical history non-contributory   . Obesity     Patient Active Problem List   Diagnosis Date Noted  . ADHD (attention deficit hyperactivity disorder) 04/17/2013  . Cholecystitis 04/15/2013  . Pregnancy 03/14/2013  . Rh negative state in antepartum period 01/25/2013  . 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 Mental disorders of mother, antepartum 11/08/2012  . Supervision of normal first pregnancy 11/02/2012  . Generalized anxiety disorder 08/27/2011  . 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 Depression, major, single episode, moderate (HCC) 08/24/2011  . Oppositional defiant disorder 08/24/2011  . Polysubstance abuse 08/24/2011    Past Surgical History:  Procedure Laterality Date  . CESAREAN SECTION N/A 03/15/2013   Procedure: CESAREAN SECTION;  Surgeon: Catalina AntiguaPeggy Constant, MD;  Location: [facility];  Service: Obstetrics;  Laterality: N/A;  . CHOLECYSTECTOMY N/A 04/16/2013   Procedure: LAPAROSCOPIC CHOLECYSTECTOMY WITH INTRAOPERATIVE CHOLANGIOGRAM;  Surgeon: Adolph Pollackodd J Rosenbower, MD;  Location: [facility];   Service: General;  Laterality: N/A;  . NO PAST SURGERIES      OB History    Gravida Para Term Preterm AB Living   2 1 1  0 0 1   SAB TAB Ectopic Multiple Live Births   0 0 0 0 1       Home Medications    Prior to Admission medications   Medication Sig Start Date End Date Taking? Authorizing Provider  acetaminophen (TYLENOL) 500 MG tablet Take 1 tablet (500 mg total) by mouth every 6 (six) hours as needed. 01/03/15   Mady GemmaElizabeth C Westfall, PA-C  cephALEXin (KEFLEX) 500 MG capsule Take 1 capsule (500 mg total) by mouth 4 (four) times daily. Patient not taking: Reported on 01/03/2015 12/22/14   Duane LopeJennifer I Rasch, NP  metroNIDAZOLE (FLAGYL) 500 MG tablet Take 1 tablet (500 mg total) by mouth 2 (two) times daily. Patient not taking: Reported on 01/03/2015 11/25/14   Verita SchneidersEvelyn M Key, NP  promethazine (PHENERGAN) 25 MG tablet Take 1 tablet (25 mg total) by mouth every 6 (six) hours as needed for nausea or vomiting. 05/27/16   Wynetta EmeryNicole Saydie Gerdts, PA-C    Family History Family History  Problem Relation Age of Onset  . Adopted: Yes  . Drug abuse Mother   . Drug abuse Father     Social History Social History  Substance Use Topics  . Smoking status: Former Smoker    Types: Cigarettes  . Smokeless tobacco: Never Used  . Alcohol use No     Comment: "maybe monthly"     Allergies   Patient has no known allergies.   Review of Systems Review of Systems   10 systems reviewed and found to be negative, except as noted in the HPI.  Physical Exam Updated Vital Signs BP 105/65 (BP Location: Left Arm)   Pulse 79   Temp 98.4 F (36.9 C) (Oral)   Resp 16   Ht 5\' 4"  (1.626 m)   Wt 81.6 kg   LMP  (LMP Unknown)   SpO2 99%   BMI 30.90 kg/m   Physical Exam  Constitutional: She is oriented to person, place, and time. She appears well-developed and well-nourished. No distress.  HENT:  Head: Normocephalic and atraumatic.  Mouth/Throat: Oropharynx is clear and moist.  Eyes: Conjunctivae and  EOM are normal. Pupils are equal, round, and reactive to light.  Neck: Normal range of motion.  Cardiovascular: Normal rate, regular rhythm and intact distal pulses.   Pulmonary/Chest: Effort normal and breath sounds normal.  Abdominal: Soft. She exhibits no distension and no mass. There is tenderness. There is no rebound and no guarding. No hernia.  Mild, diffuse tenderness to palpation with no guarding or rebound.  Murphy sign negative, no tenderness to palpation over McBurney's point, Rovsings, Psoas and obturator all negative.   Musculoskeletal: Normal range of motion.  Neurological: She is alert and oriented to person, place, and time.  Skin: She is not diaphoretic.  Psychiatric: She has a normal mood and affect.  Nursing note and vitals reviewed.    ED Treatments / Results  Labs (all labs ordered are listed, but only abnormal results are displayed) Labs Reviewed  COMPREHENSIVE METABOLIC PANEL - Abnormal; Notable for the following:       Result Value   Total Bilirubin 1.6 (*)    All other components within normal limits  CBC - Abnormal; Notable for the following:    WBC 10.7 (*)    All other components within normal limits  LIPASE, BLOOD  URINALYSIS, ROUTINE W REFLEX MICROSCOPIC  I-STAT BETA HCG BLOOD, ED ([facility], [facility], [facility] ONLY)    EKG  EKG Interpretation None       Radiology No results found.  Procedures Procedures (including critical care time)  Medications Ordered in ED Medications  ondansetron (ZOFRAN-ODT) disintegrating tablet 4 mg (4 mg Oral Given 05/26/16 1931)  sodium chloride 0.9 % bolus 1,000 mL (0 mLs Intravenous Stopped 05/27/16 0018)  ondansetron (ZOFRAN) injection 4 mg (4 mg Intravenous Given 05/26/16 2318)     Initial Impression / Assessment and Plan / ED Course  I have reviewed the triage vital signs and the nursing notes.  Pertinent labs & imaging results that were available during my care of the patient were reviewed by me and considered in my  medical decision making (see chart for details).     Vitals:   05/26/16 1924 05/26/16 2148 05/27/16 0041  BP: 134/84 121/76 105/65  Pulse: 80 90 79  Resp: 18  16  Temp: 98.4 F (36.9 C)    TempSrc: Oral    SpO2: 100% 100% 99%  Weight: 81.6 kg    Height: 5\' 4"  (1.626 m)      Medications  ondansetron (ZOFRAN-ODT) disintegrating tablet 4 mg (4 mg Oral Given 05/26/16 1931)  sodium chloride 0.9 % bolus 1,000 mL (0 mLs Intravenous Stopped 05/27/16 0018)  ondansetron (ZOFRAN) injection 4 mg (4 mg Intravenous Given 05/26/16 2318)    Morgan Lewis is 21 y.o. female presenting with nausea vomiting diarrhea abdominal pain, abdominal exam is nonfocal. Vital signs reassuring. Blood work reassuring. Patient hydrated and she by mouth challenge without complication. Repeat abdominal exam unchanged. Extensive discussion of return precautions and patient verbalized understanding and teach back technique.  Evaluation  does not show pathology that would require ongoing emergent intervention or inpatient treatment. Pt is hemodynamically stable and mentating appropriately. Discussed findings and plan with patient/guardian, who agrees with care plan. All questions answered. Return precautions discussed and outpatient follow up given.      Final Clinical Impressions(s) / ED Diagnoses   Final diagnoses:  Nausea vomiting and diarrhea    New Prescriptions Discharge Medication List as of 05/27/2016 12:21 AM    START taking these medications   Details  promethazine (PHENERGAN) 25 MG tablet Take 1 tablet (25 mg total) by mouth every 6 (six) hours as needed for nausea or vomiting., Starting Wed 05/27/2016, Print         [place], PA-C 05/27/16 1610    Loren Racer, MD 06/07/16 [phone]

## 2016-05-26 NOTE — ED Triage Notes (Signed)
Pt states she has not been able to eat for for 4 days; pt states she is heavy smoker and has been able to smoke but not eat; pt states new onset of abdominal pain and emesis today; pt she has been weak all day; pt is tearful  At triage and keep demanding something to drink; pt has been told she is NPO; Pt c/o pain at 8/10

## 2016-05-27 MED ORDER — PROMETHAZINE HCL 25 MG PO TABS: 25.0000 mg | ORAL_TABLET | Freq: Four times a day (QID) | ORAL | 0 refills | Status: DC | PRN

## 2016-05-27 NOTE — ED Notes (Signed)
Patient tolerated ginger ale well, no vomiting noted. PA-C at bedside.

## 2016-05-27 NOTE — Discharge Instructions (Signed)
Push fluids: take small frequent sips of water or Gatorade, do not drink any soda, juice or caffeinated beverages.    Slowly resume solid diet as desired. Avoid food that are spicy, contain dairy and/or have high fat content.  Aviod NSAIDs (aspirin, motrin, ibuprofen, naproxen, Aleve et Karie Sodacetera) for pain control because they will irritate your stomach.  ,npdcge

## 2016-09-02 ENCOUNTER — Encounter (HOSPITAL_COMMUNITY): Payer: Self-pay | Admitting: *Deleted

## 2016-09-02 ENCOUNTER — Inpatient Hospital Stay (HOSPITAL_COMMUNITY): Payer: Self-pay

## 2016-09-02 ENCOUNTER — Inpatient Hospital Stay (HOSPITAL_COMMUNITY)
Admission: AD | Admit: 2016-09-02 | Discharge: 2016-09-02 | Disposition: A | Discharge status: Home / Self Care | Payer: Self-pay | Source: Ambulatory Visit | Attending: Family Medicine | Admitting: Family Medicine

## 2016-09-02 DIAGNOSIS — Z3A10 10 weeks gestation of pregnancy: Secondary | ICD-10-CM | POA: Insufficient documentation

## 2016-09-02 DIAGNOSIS — R109 Unspecified abdominal pain: Secondary | ICD-10-CM

## 2016-09-02 DIAGNOSIS — O9989 Other specified diseases and conditions complicating pregnancy, childbirth and the puerperium: Secondary | ICD-10-CM

## 2016-09-02 DIAGNOSIS — Z202 Contact with and (suspected) exposure to infections with a predominantly sexual mode of transmission: Secondary | ICD-10-CM | POA: Insufficient documentation

## 2016-09-02 DIAGNOSIS — Z79899 Other long term (current) drug therapy: Secondary | ICD-10-CM | POA: Insufficient documentation

## 2016-09-02 DIAGNOSIS — O26891 Other specified pregnancy related conditions, first trimester: Secondary | ICD-10-CM | POA: Insufficient documentation

## 2016-09-02 DIAGNOSIS — Z3491 Encounter for supervision of normal pregnancy, unspecified, first trimester: Secondary | ICD-10-CM

## 2016-09-02 DIAGNOSIS — O26899 Other specified pregnancy related conditions, unspecified trimester: Secondary | ICD-10-CM

## 2016-09-02 DIAGNOSIS — O99212 Obesity complicating pregnancy, second trimester: Secondary | ICD-10-CM | POA: Insufficient documentation

## 2016-09-02 DIAGNOSIS — B9689 Other specified bacterial agents as the cause of diseases classified elsewhere: Secondary | ICD-10-CM | POA: Insufficient documentation

## 2016-09-02 DIAGNOSIS — F909 Attention-deficit hyperactivity disorder, unspecified type: Secondary | ICD-10-CM | POA: Insufficient documentation

## 2016-09-02 DIAGNOSIS — E669 Obesity, unspecified: Secondary | ICD-10-CM | POA: Insufficient documentation

## 2016-09-02 DIAGNOSIS — N760 Acute vaginitis: Secondary | ICD-10-CM | POA: Insufficient documentation

## 2016-09-02 DIAGNOSIS — Z87891 Personal history of nicotine dependence: Secondary | ICD-10-CM | POA: Insufficient documentation

## 2016-09-02 DIAGNOSIS — O99342 Other mental disorders complicating pregnancy, second trimester: Secondary | ICD-10-CM | POA: Insufficient documentation

## 2016-09-02 LAB — URINALYSIS, ROUTINE W REFLEX MICROSCOPIC
Bilirubin Urine: NEGATIVE
Glucose, UA: NEGATIVE mg/dL
HGB URINE DIPSTICK: NEGATIVE
KETONES UR: 5 mg/dL — AB
NITRITE: NEGATIVE
PROTEIN: NEGATIVE mg/dL
Specific Gravity, Urine: 1.024 (ref 1.005–1.030)
pH: 5 (ref 5.0–8.0)

## 2016-09-02 LAB — GC/CHLAMYDIA PROBE AMP (~~LOC~~) NOT AT ARMC
Chlamydia: NEGATIVE
Neisseria Gonorrhea: NEGATIVE

## 2016-09-02 LAB — HIV ANTIBODY (ROUTINE TESTING W REFLEX): HIV SCREEN 4TH GENERATION: NONREACTIVE

## 2016-09-02 LAB — WET PREP, GENITAL
Sperm: NONE SEEN
Trich, Wet Prep: NONE SEEN
YEAST WET PREP: NONE SEEN

## 2016-09-02 LAB — RAPID URINE DRUG SCREEN, HOSP PERFORMED
AMPHETAMINES: NOT DETECTED
BENZODIAZEPINES: NOT DETECTED
Barbiturates: NOT DETECTED
Cocaine: NOT DETECTED
OPIATES: NOT DETECTED
Tetrahydrocannabinol: NOT DETECTED

## 2016-09-02 LAB — POCT PREGNANCY, URINE: PREG TEST UR: POSITIVE — AB

## 2016-09-02 LAB — CBC
HCT: 35.4 % — ABNORMAL LOW (ref 36.0–46.0)
HEMOGLOBIN: 12.7 g/dL (ref 12.0–15.0)
MCH: 30.2 pg (ref 26.0–34.0)
MCHC: 35.9 g/dL (ref 30.0–36.0)
MCV: 84.3 fL (ref 78.0–100.0)
PLATELETS: 302 10*3/uL (ref 150–400)
RBC: 4.2 MIL/uL (ref 3.87–5.11)
RDW: 12.9 % (ref 11.5–15.5)
WBC: 11.3 10*3/uL — ABNORMAL HIGH (ref 4.0–10.5)

## 2016-09-02 LAB — HCG, QUANTITATIVE, PREGNANCY: HCG, BETA CHAIN, QUANT, S: 50403 m[IU]/mL — AB (ref <5)

## 2016-09-02 MED ORDER — BUTALBITAL-APAP-CAFFEINE 50-325-40 MG PO TABS
2.0000 | ORAL_TABLET | Freq: Once | ORAL | Status: DC
  Filled 2016-09-02: qty 2

## 2016-09-02 MED ORDER — METRONIDAZOLE 500 MG PO TABS: 500.0000 mg | ORAL_TABLET | Freq: Two times a day (BID) | ORAL | 0 refills | Status: DC

## 2016-09-02 MED ORDER — METRONIDAZOLE 500 MG PO TABS
500.0000 mg | ORAL_TABLET | Freq: Two times a day (BID) | ORAL | 0 refills | Status: DC
Start: 2016-09-02 — End: 2016-09-02

## 2016-09-02 MED ORDER — CEFTRIAXONE SODIUM 250 MG IJ SOLR
250.0000 mg | Freq: Once | INTRAMUSCULAR | Status: AC
  Administered 2016-09-02: 250 mg via INTRAMUSCULAR
  Filled 2016-09-02: qty 250

## 2016-09-02 MED ORDER — AZITHROMYCIN 250 MG PO TABS
1000.0000 mg | ORAL_TABLET | Freq: Once | ORAL | Status: AC
  Administered 2016-09-02: 250 mg via ORAL
  Filled 2016-09-02: qty 4

## 2016-09-02 NOTE — MAU Provider Note (Signed)
Chief Complaint: No chief complaint on file.   None     SUBJECTIVE HPI: Morgan Lewis is a 21 y.o. G2P1002 at [redacted]w[redacted]d by unsure LMP who presents to maternity admissions reporting abdominal cramping, vaginal discharge with odor, and intermittent h/a x 2 weeks.  She reports known exposure to STD.  Her boyfriend was treated and told her she needed to be treated. She was treated for chlamydia at [facility] but then had sex with her boyfriend again and is concerned because of her symptoms that she was reexposed.  She desires treatment today in MAU.  She has hx of migraines and reports they have been worse since the pregnancy. She has not tried any treatments, nothing makes her h/a or abdominal pain better or worse. There are no other associated symptoms. She denies vaginal bleeding, vaginal itching/burning, urinary symptoms, h/a, dizziness, n/v, or fever/chills.     HPI  Past Medical History:  Diagnosis Date  . ADHD (attention deficit hyperactivity disorder) 04/17/2013  . 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 Medical history non-contributory   . Obesity    Past Surgical History:  Procedure Laterality Date  . CESAREAN SECTION N/A 03/15/2013   Procedure: CESAREAN SECTION;  Surgeon: Catalina Antigua, MD;  Location: [facility];  Service: Obstetrics;  Laterality: N/A;  . CHOLECYSTECTOMY N/A 04/16/2013   Procedure: LAPAROSCOPIC CHOLECYSTECTOMY WITH INTRAOPERATIVE CHOLANGIOGRAM;  Surgeon: Adolph Pollack, MD;  Location: [facility];  Service: General;  Laterality: N/A;  . NO PAST SURGERIES     Social History   Social History  . Marital status: Single    Spouse name: N/A  . Number of children: N/A  . Years of education: N/A   Occupational History  . Student     11th grade at [employer]   Social History Main Topics  . Smoking status: Former Smoker    Types: Cigarettes  . Smokeless tobacco: Never Used  . Alcohol use No     Comment: "maybe monthly"  . Drug use: No      Comment: when 4 mos pregnant  . Sexual activity: Yes    Partners: Male    Birth control/ protection: None   Other Topics Concern  . Not on file   Social History Narrative  . No narrative on file   No current facility-administered medications on file prior to encounter.    Current Outpatient Prescriptions on File Prior to Encounter  Medication Sig Dispense Refill  . acetaminophen (TYLENOL) 500 MG tablet Take 1 tablet (500 mg total) by mouth every 6 (six) hours as needed. 30 tablet 0  . cephALEXin (KEFLEX) 500 MG capsule Take 1 capsule (500 mg total) by mouth 4 (four) times daily. (Patient not taking: Reported on 01/03/2015) 20 capsule 0  . metroNIDAZOLE (FLAGYL) 500 MG tablet Take 1 tablet (500 mg total) by mouth 2 (two) times daily. (Patient not taking: Reported on 01/03/2015) 14 tablet 0  . promethazine (PHENERGAN) 25 MG tablet Take 1 tablet (25 mg total) by mouth every 6 (six) hours as needed for nausea or vomiting. 12 tablet 0   No Known Allergies  ROS:  Review of Systems  Constitutional: Negative for chills, fatigue and fever.  Respiratory: Negative for shortness of breath.   Cardiovascular: Negative for chest pain.  Gastrointestinal: Negative for nausea and vomiting.  Genitourinary: Positive for pelvic pain and vaginal discharge. Negative for difficulty urinating, dysuria, flank pain, vaginal bleeding and vaginal pain.  Neurological: Negative for dizziness and headaches.  Psychiatric/Behavioral: Negative.  I have reviewed patient's Past Medical Hx, Surgical Hx, Family Hx, Social Hx, medications and allergies.   Physical Exam   Patient Vitals for the past 24 hrs:  BP Temp Temp src Pulse Resp Height Weight  09/02/16 0456 (!) 106/57 - - 66 - - -  09/02/16 0154 108/70 98.3 F (36.8 C) Oral 93 20 5\' 6"  (1.676 m) 187 lb (84.8 kg)   Constitutional: Well-developed, well-nourished female in no acute distress.  Cardiovascular: normal rate Respiratory: normal effort GI: Abd  soft, non-tender. Pos BS x 4 MS: Extremities nontender, no edema, normal ROM Neurologic: Alert and oriented x 4.  GU: Neg CVAT.  PELVIC EXAM: Cervix pink, visually closed, without lesion, moderate thick yellow discharge, vaginal walls and external genitalia normal Bimanual exam: Cervix 0/long/high, firm, anterior, neg CMT, uterus nontender, ~ 10 week size, adnexa without tenderness, enlargement, or mass   LAB RESULTS Results for orders placed or performed during the hospital encounter of 09/02/16 (from the past 24 hour(s))  Urinalysis, Routine w reflex microscopic     Status: Abnormal   Collection Time: 09/02/16  1:30 AM  Result Value Ref Range   Color, Urine YELLOW YELLOW   APPearance HAZY (A) CLEAR   Specific Gravity, Urine 1.024 1.005 - 1.030   pH 5.0 5.0 - 8.0   Glucose, UA NEGATIVE NEGATIVE mg/dL   Hgb urine dipstick NEGATIVE NEGATIVE   Bilirubin Urine NEGATIVE NEGATIVE   Ketones, ur 5 (A) NEGATIVE mg/dL   Protein, ur NEGATIVE NEGATIVE mg/dL   Nitrite NEGATIVE NEGATIVE   Leukocytes, UA TRACE (A) NEGATIVE   RBC / HPF 0-5 0 - 5 RBC/hpf   WBC, UA 0-5 0 - 5 WBC/hpf   Bacteria, UA RARE (A) NONE SEEN   Squamous Epithelial / LPF 6-30 (A) NONE SEEN   Mucous PRESENT   Rapid urine drug screen (hospital performed)     Status: None   Collection Time: 09/02/16  1:30 AM  Result Value Ref Range   Opiates NONE DETECTED NONE DETECTED   Cocaine NONE DETECTED NONE DETECTED   Benzodiazepines NONE DETECTED NONE DETECTED   Amphetamines NONE DETECTED NONE DETECTED   Tetrahydrocannabinol NONE DETECTED NONE DETECTED   Barbiturates NONE DETECTED NONE DETECTED  Pregnancy, urine POC     Status: Abnormal   Collection Time: 09/02/16  1:43 AM  Result Value Ref Range   Preg Test, Ur POSITIVE (A) NEGATIVE  CBC     Status: Abnormal   Collection Time: 09/02/16  3:04 AM  Result Value Ref Range   WBC 11.3 (H) 4.0 - 10.5 K/uL   RBC 4.20 3.87 - 5.11 MIL/uL   Hemoglobin 12.7 12.0 - 15.0 g/dL   HCT 16.1  (L) 09.6 - 46.0 %   MCV 84.3 78.0 - 100.0 fL   MCH 30.2 26.0 - 34.0 pg   MCHC 35.9 30.0 - 36.0 g/dL   RDW 04.5 40.9 - 81.1 %   Platelets 302 150 - 400 K/uL  hCG, quantitative, pregnancy     Status: Abnormal   Collection Time: 09/02/16  3:04 AM  Result Value Ref Range   hCG, Beta Chain, Quant, S 50,403 (H) <5 mIU/mL  Wet prep, genital     Status: Abnormal   Collection Time: 09/02/16  3:06 AM  Result Value Ref Range   Yeast Wet Prep HPF POC NONE SEEN NONE SEEN   Trich, Wet Prep NONE SEEN NONE SEEN   Clue Cells Wet Prep HPF POC PRESENT (A) NONE SEEN   WBC,  Wet Prep HPF POC FEW (A) NONE SEEN   Sperm NONE SEEN        IMAGING US Ob Comp Less 14 Wks  Result Date: 09/02/2016 CLINICAL DATA:  Acute onset of intermittent upper abdominal pain. Initial encounter. EXAM: OBSTETRIC <14 WK [place] AND TRANSVAGINAL OB US TECHNIQUE: Both transabdominal and transvaginal ultrasound examinations were performed for complete evaluation of the gestation as well as the maternal uterus, adnexal regions, and pelvic cul-de-sac. Transvaginal technique was performed to assess early pregnancy. COMPARISON:  Pelvic ultrasound performed 12/22/2014 FINDINGS: Intrauterine gestational sac: Single; visualized and normal in shape. Yolk sac:  Yes Embryo:  Yes Cardiac Activity: Yes Heart Rate: 171  bpm CRL:  3.74 cm   10 w   4 d                  [place] EDC: 03/27/2017 Subchorionic hemorrhage: A small amount of subchorionic hemorrhage is noted. Maternal uterus/adnexae: The uterus is otherwise unremarkable in appearance. The ovaries are within normal limits. The right ovary measures 4.0 x 1.2 x 2.0 cm, while the left ovary measures 3.0 x 2.2 x 2.6 cm. No suspicious adnexal masses are seen; there is no evidence for ovarian torsion. Trace free fluid is seen within the pelvic cul-de-sac. IMPRESSION: 1. Single live intrauterine pregnancy noted, with a crown-rump length of 3.7 cm, corresponding to a gestational age of [redacted] weeks 4 days. This reflects  an estimated date of delivery of March 27, 2017. 2. Small amount of subchorionic hemorrhage noted. Electronically Signed   By: Roanna Raider M.D.   On: 09/02/2016 04:12   US Ob Transvaginal  Result Date: 09/02/2016 CLINICAL DATA:  Acute onset of intermittent upper abdominal pain. Initial encounter. EXAM: OBSTETRIC <14 WK [place] AND TRANSVAGINAL OB US TECHNIQUE: Both transabdominal and transvaginal ultrasound examinations were performed for complete evaluation of the gestation as well as the maternal uterus, adnexal regions, and pelvic cul-de-sac. Transvaginal technique was performed to assess early pregnancy. COMPARISON:  Pelvic ultrasound performed 12/22/2014 FINDINGS: Intrauterine gestational sac: Single; visualized and normal in shape. Yolk sac:  Yes Embryo:  Yes Cardiac Activity: Yes Heart Rate: 171  bpm CRL:  3.74 cm   10 w   4 d                  [place] EDC: 03/27/2017 Subchorionic hemorrhage: A small amount of subchorionic hemorrhage is noted. Maternal uterus/adnexae: The uterus is otherwise unremarkable in appearance. The ovaries are within normal limits. The right ovary measures 4.0 x 1.2 x 2.0 cm, while the left ovary measures 3.0 x 2.2 x 2.6 cm. No suspicious adnexal masses are seen; there is no evidence for ovarian torsion. Trace free fluid is seen within the pelvic cul-de-sac. IMPRESSION: 1. Single live intrauterine pregnancy noted, with a crown-rump length of 3.7 cm, corresponding to a gestational age of [redacted] weeks 4 days. This reflects an estimated date of delivery of March 27, 2017. 2. Small amount of subchorionic hemorrhage noted. Electronically Signed   By: Roanna Raider M.D.   On: 09/02/2016 04:12    MAU Management/MDM: Ordered labs and [place] and reviewed results.   Pt treated in MAU for exposure to STD with azithromycin 1000 mg PO and Rocephin 250 mg IM.  Pt partner is incarcerated so no expedited partner therapy.  IUP on [place] today, dates c/w LMP.  Wet prep positive for clue cells and pt with  malodorous discharge.  Will treat BV with Flagyl 500 mg BID x 7 days.  GCC pending.  Pt to [facility] soon as possible. [facility] or [facility] as needed for emergencies.  Pt stable at time of discharge.  ASSESSMENT 1. BV (bacterial vaginosis)   2. Abdominal pain during pregnancy, first trimester   3. Abdominal pain during intrauterine pregnancy   4. Normal IUP (intrauterine pregnancy) on prenatal ultrasound, first trimester   5. Possible exposure to STD     PLAN Discharge home Allergies as of 09/02/2016   No Known Allergies     Medication List    STOP taking these medications   cephALEXin 500 MG capsule Commonly known as:  KEFLEX     TAKE these medications   acetaminophen 500 MG tablet Commonly known as:  TYLENOL Take 1 tablet (500 mg total) by mouth every 6 (six) hours as needed.   metroNIDAZOLE 500 MG tablet Commonly known as:  FLAGYL Take 1 tablet (500 mg total) by mouth 2 (two) times daily.   promethazine 25 MG tablet Commonly known as:  PHENERGAN Take 1 tablet (25 mg total) by mouth every 6 (six) hours as needed for nausea or vomiting.      Follow-up Information    [facility] Follow up.   Why:  Or prenatal provider of your choice, see list provided.  Return to MAU as needed for emergencies.          Sharen CounterLisa Leftwich-Kirby Certified Nurse-Midwife 09/02/2016  5:15 AM

## 2016-09-02 NOTE — MAU Note (Signed)
PT  SAYS SHE WENT   TO  [facility] IN      [place].    -   IN April .   WENT FOR ABD PAIN- BC  BOYFRIEND TOLD HER SHE HAD  STD-  BC HE ALREADY TOOK MEDS .       [facility]  TOLD  HER SHE HAD CHLAM - GAVE  HER AN INJECTION .    SHE HAD SEX WITH HIM AGAIN - AND NOW  SHE  HAS SAME ODOR     BOYFRIEND  [place].    [facility] UPT  - POSITIVE.-        SAYS SHE HAS  BEEN WEAK  AND HER BACK HURTS  AND LOWER  ABD  HURTS  AND  SHE HAS NOT  BEEN TO DR.      PLANS  TO GO [place] .

## 2016-11-15 ENCOUNTER — Encounter (HOSPITAL_COMMUNITY): Payer: Self-pay | Admitting: Certified Nurse Midwife

## 2016-11-15 ENCOUNTER — Inpatient Hospital Stay (HOSPITAL_COMMUNITY)
Admission: AD | Admit: 2016-11-15 | Discharge: 2016-11-16 | Disposition: A | Discharge status: Home / Self Care | Payer: Medicaid Other | Source: Ambulatory Visit | Attending: Obstetrics & Gynecology | Admitting: Obstetrics & Gynecology

## 2016-11-15 DIAGNOSIS — O99342 Other mental disorders complicating pregnancy, second trimester: Secondary | ICD-10-CM | POA: Insufficient documentation

## 2016-11-15 DIAGNOSIS — G43909 Migraine, unspecified, not intractable, without status migrainosus: Secondary | ICD-10-CM | POA: Insufficient documentation

## 2016-11-15 DIAGNOSIS — M549 Dorsalgia, unspecified: Secondary | ICD-10-CM

## 2016-11-15 DIAGNOSIS — G43009 Migraine without aura, not intractable, without status migrainosus: Secondary | ICD-10-CM | POA: Diagnosis not present

## 2016-11-15 DIAGNOSIS — O9989 Other specified diseases and conditions complicating pregnancy, childbirth and the puerperium: Secondary | ICD-10-CM

## 2016-11-15 DIAGNOSIS — Z3A21 21 weeks gestation of pregnancy: Secondary | ICD-10-CM | POA: Insufficient documentation

## 2016-11-15 DIAGNOSIS — F909 Attention-deficit hyperactivity disorder, unspecified type: Secondary | ICD-10-CM | POA: Insufficient documentation

## 2016-11-15 DIAGNOSIS — O26892 Other specified pregnancy related conditions, second trimester: Secondary | ICD-10-CM | POA: Insufficient documentation

## 2016-11-15 DIAGNOSIS — Z87891 Personal history of nicotine dependence: Secondary | ICD-10-CM | POA: Insufficient documentation

## 2016-11-15 MED ORDER — METOCLOPRAMIDE HCL 5 MG/ML IJ SOLN
10.0000 mg | Freq: Once | INTRAMUSCULAR | Status: AC
  Administered 2016-11-16: 10 mg via INTRAVENOUS

## 2016-11-15 MED ORDER — DIPHENHYDRAMINE HCL 50 MG/ML IJ SOLN
25.0000 mg | Freq: Once | INTRAMUSCULAR | Status: AC
  Administered 2016-11-16: 25 mg via INTRAVENOUS
  Filled 2016-11-15: qty 1

## 2016-11-15 MED ORDER — DEXAMETHASONE SODIUM PHOSPHATE 10 MG/ML IJ SOLN
10.0000 mg | Freq: Once | INTRAMUSCULAR | Status: AC
  Administered 2016-11-16: 10 mg via INTRAVENOUS
  Filled 2016-11-15: qty 1

## 2016-11-15 MED ORDER — SODIUM CHLORIDE 0.9 % IV BOLUS (SEPSIS)
1000.0000 mL | Freq: Once | INTRAVENOUS | Status: AC
  Administered 2016-11-15: 1000 mL via INTRAVENOUS

## 2016-11-15 NOTE — MAU Note (Signed)
Urine sent to lab 

## 2016-11-15 NOTE — MAU Note (Signed)
Patient presents to MAU with c/o migraine and back pain. Patient has history of migraines- current migraine started last night, has taken ibuprofen this morning and drank water. Patient states she has back pain everyday for the past week.  Denies VB and LOF. Patient does not know if baby is moving. Has not been to a prenatal visit during this pregnancy- waiting on pregnancy medicaid. Was here in May where she was diagnosed with BV- has not taken medication for it because of no insurance.

## 2016-11-15 NOTE — MAU Provider Note (Signed)
History     CSN: [id number]  Arrival date and time: 11/15/16 2224   First Provider Initiated Contact with Patient 11/15/16 2312      Chief Complaint  Patient presents with  . Headache  . Back Pain   HPI Ms. Analaya Hoey is a 21 y.o. G3P1002 at [redacted]w[redacted]d who presents to MAU today with complaint of migraine today. The states that she has tried something for pain, unsure if it is ibuprofen or Tylenol. She denies any relief with this medication. She rates her migraine headache at 8/10 now. She has a history of migraines. She denies associated N/V but states some mild photophobia. She also complains of low back pain x 1 week. The pain does not radiate. It is aggravated by listing her other child. She denies flank pain or UTI symptoms. She also denies vaginal bleeding. She states that she has not had prenatal care yet because she doesn't have her Medicaid yet because she doesn't have time to go to the [facility] office.  OB History    Gravida Para Term Preterm AB Living   3 1 1  0 0 2   SAB TAB Ectopic Multiple Live Births   0 0 0 0 2      Past Medical History:  Diagnosis Date  . ADHD (attention deficit hyperactivity disorder) 04/17/2013  . 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 Medical history non-contributory   . Obesity     Past Surgical History:  Procedure Laterality Date  . CESAREAN SECTION N/A 03/15/2013   Procedure: CESAREAN SECTION;  Surgeon: Catalina Antigua, MD;  Location: [facility];  Service: Obstetrics;  Laterality: N/A;  . CHOLECYSTECTOMY N/A 04/16/2013   Procedure: LAPAROSCOPIC CHOLECYSTECTOMY WITH INTRAOPERATIVE CHOLANGIOGRAM;  Surgeon: Adolph Pollack, MD;  Location: [facility];  Service: General;  Laterality: N/A;  . NO PAST SURGERIES      Family History  Problem Relation Age of Onset  . Adopted: Yes  . Drug abuse Mother   . Drug abuse Father     Social History  Substance Use Topics  . Smoking status: Former Smoker    Types: Cigarettes  .  Smokeless tobacco: Never Used  . Alcohol use No     Comment: "maybe monthly"    Allergies: No Known Allergies  Prescriptions Prior to Admission  Medication Sig Dispense Refill Last Dose  . acetaminophen (TYLENOL) 500 MG tablet Take 1 tablet (500 mg total) by mouth every 6 (six) hours as needed. 30 tablet 0 11/14/2016 at Unknown time  . metroNIDAZOLE (FLAGYL) 500 MG tablet Take 1 tablet (500 mg total) by mouth 2 (two) times daily. 14 tablet 0   . promethazine (PHENERGAN) 25 MG tablet Take 1 tablet (25 mg total) by mouth every 6 (six) hours as needed for nausea or vomiting. 12 tablet 0     Review of Systems  Constitutional: Negative for fever.  Eyes: Positive for photophobia. Negative for visual disturbance.  Gastrointestinal: Negative for abdominal pain, constipation, diarrhea, nausea and vomiting.  Genitourinary: Negative for dysuria, frequency, urgency, vaginal bleeding and vaginal discharge.  Musculoskeletal: Positive for back pain.  Neurological: Positive for headaches.   Physical Exam   Blood pressure 111/73, pulse 90, temperature 98.1 F (36.7 C), temperature source Oral, resp. rate 16, height 5\' 6"  (1.676 m), weight 196 lb (88.9 kg), last menstrual period 06/16/2016, SpO2 99 %, unknown if currently breastfeeding.  Physical Exam  Nursing note and vitals reviewed. Constitutional: She is oriented to person, place, and time. She appears  well-developed and well-nourished. No distress.  HENT:  Head: Normocephalic and atraumatic.  Cardiovascular: Normal rate.   Respiratory: Effort normal.  GI: Soft. She exhibits no distension and no mass. There is no tenderness. There is no rebound, no guarding and no CVA tenderness.  Musculoskeletal:       Lumbar back: She exhibits normal range of motion, no tenderness, no bony tenderness, no swelling, no edema, no pain and no spasm.  Neurological: She is alert and oriented to person, place, and time.  Skin: Skin is warm and dry. No erythema.   Psychiatric: She has a normal mood and affect.   MAU Course  Procedures None  MDM FHR - 156 bpm with doppler IV NS with 25 mg Benadryl, 10 mg Decadron and 10 mg Reglan for headache Patient reports resolution of headache.  Assessment and Plan  A:  SIUP at 781w5d Back pain in pregnancy, second trimester Migraine headache   P:  Discharge home Tylenol PRN for pain advised  Warning signs for worsening condition discussed Advised abdominal binder, avoiding heavy lifting and heat to the affected area for back pain. Discussed possible need for PT if symptoms persist or worsen.  Patient advised to follow-up with [facility] to start prenatal care. In-basket message sent. They will call patient with an appointment.  Patient may return to MAU as needed or if her condition were to change or worsen  Vonzella NippleJulie Quanisha Drewry, PA-C 11/16/2016, 12:33 AM

## 2016-11-16 DIAGNOSIS — O26892 Other specified pregnancy related conditions, second trimester: Secondary | ICD-10-CM

## 2016-11-16 DIAGNOSIS — G43009 Migraine without aura, not intractable, without status migrainosus: Secondary | ICD-10-CM | POA: Diagnosis not present

## 2016-11-16 NOTE — Discharge Instructions (Signed)
Migraine Headache A migraine headache is a very strong throbbing pain on one side or both sides of your head. Migraines can also cause other symptoms. Talk with your doctor about what things may bring on (trigger) your migraine headaches. Follow these instructions at home: Medicines  Take over-the-counter and prescription medicines only as told by your doctor.  Do not drive or use heavy machinery while taking prescription pain medicine.  To prevent or treat constipation while you are taking prescription pain medicine, your doctor may recommend that you: ? Drink enough fluid to keep your pee (urine) clear or pale yellow. ? Take over-the-counter or prescription medicines. ? Eat foods that are high in fiber. These include fresh fruits and vegetables, whole grains, and beans. ? Limit foods that are high in fat and processed sugars. These include fried and sweet foods. Lifestyle  Avoid alcohol.  Do not use any products that contain nicotine or tobacco, such as cigarettes and e-cigarettes. If you need help quitting, ask your doctor.  Get at least 8 hours of sleep every night.  Limit your stress. General instructions   Keep a journal to find out what may bring on your migraines. For example, write down: ? What you eat and drink. ? How much sleep you get. ? Any change in what you eat or drink. ? Any change in your medicines.  If you have a migraine: ? Avoid things that make your symptoms worse, such as bright lights. ? It may help to lie down in a dark, quiet room. ? Do not drive or use heavy machinery. ? Ask your doctor what activities are safe for you.  Keep all follow-up visits as told by your doctor. This is important. Contact a doctor if:  You get a migraine that is different or worse than your usual migraines. Get help right away if:  Your migraine gets very bad.  You have a fever.  You have a stiff neck.  You have trouble seeing.  Your muscles feel weak or like you  cannot control them.  You start to lose your balance a lot.  You start to have trouble walking.  You pass out (faint). This information is not intended to replace advice given to you by your health care provider. Make sure you discuss any questions you have with your health care provider. Document Released: 12/31/2007 Document Revised: 10/11/2015 Document Reviewed: 09/09/2015 Elsevier Interactive Patient Education  2017 [employer] Back Pain in Pregnancy Back pain during pregnancy is common. Back pain may be caused by several factors that are related to changes during your pregnancy. Follow these instructions at home: Managing pain, stiffness, and swelling  If directed, apply ice for sudden (acute) back pain. ? Put ice in a plastic bag. ? Place a towel between your skin and the bag. ? Leave the ice on for 20 minutes, 2-3 times per day.  If directed, apply heat to the affected area before you exercise: ? Place a towel between your skin and the heat pack or heating pad. ? Leave the heat on for 20-30 minutes. ? Remove the heat if your skin turns bright red. This is especially important if you are unable to feel pain, heat, or cold. You may have a greater risk of getting burned. Activity  Exercise as told by your health care provider. Exercising is the best way to prevent or manage back pain.  Listen to your body when lifting. If lifting hurts, ask for help or bend your knees. This uses  your leg muscles instead of your back muscles.  Squat down when picking up something from the floor. Do not bend over.  Only use bed rest as told by your health care provider. Bed rest should only be used for the most severe episodes of back pain. Standing, Sitting, and Lying Down  Do not stand in one place for long periods of time.  Use good posture when sitting. Make sure your head rests over your shoulders and is not hanging forward. Use a pillow on your lower back if necessary.  Try sleeping  on your side, preferably the left side, with a pillow or two between your legs. If you are sore after a night's rest, your bed may be too soft. A firm mattress may provide more support for your back during pregnancy. General instructions  Do not wear high heels.  Eat a healthy diet. Try to gain weight within your health care provider's recommendations.  Use a maternity girdle, elastic sling, or back brace as told by your health care provider.  Take over-the-counter and prescription medicines only as told by your health care provider.  Keep all follow-up visits as told by your health care provider. This is important. This includes any visits with any specialists, such as a physical therapist. Contact a health care provider if:  Your back pain interferes with your daily activities.  You have increasing pain in other parts of your body. Get help right away if:  You develop numbness, tingling, weakness, or problems with the use of your arms or legs.  You develop severe back pain that is not controlled with medicine.  You have a sudden change in bowel or bladder control.  You develop shortness of breath, dizziness, or you faint.  You develop nausea, vomiting, or sweating.  You have back pain that is a rhythmic, cramping pain similar to labor pains. Labor pain is usually 1-2 minutes apart, lasts for about 1 minute, and involves a bearing down feeling or pressure in your pelvis.  You have back pain and your water breaks or you have vaginal bleeding.  You have back pain or numbness that travels down your leg.  Your back pain developed after you fell.  You develop pain on one side of your back.  You see blood in your urine.  You develop skin blisters in the area of your back pain. This information is not intended to replace advice given to you by your health care provider. Make sure you discuss any questions you have with your health care provider. Document Released: 07/01/2005  Document Revised: 08/29/2015 Document Reviewed: 12/05/2014 Elsevier Interactive Patient Education  [employer].

## 2016-11-19 ENCOUNTER — Encounter: Payer: Self-pay | Admitting: General Practice

## 2016-11-19 ENCOUNTER — Telehealth: Payer: Self-pay | Admitting: General Practice

## 2016-11-19 NOTE — Telephone Encounter (Signed)
Called and left message on VM in regards to New OB appointment scheduled on 12/03/16 at 8:00am.  Asked patient to give our office a call if she is unable to keep this appointment.  Letter has been mailed as well.

## 2016-12-03 ENCOUNTER — Encounter: Payer: Self-pay | Admitting: Family Medicine

## 2016-12-03 ENCOUNTER — Ambulatory Visit (INDEPENDENT_AMBULATORY_CARE_PROVIDER_SITE_OTHER): Payer: Medicaid Other | Admitting: Family Medicine

## 2016-12-03 VITALS — BP 113/72 | HR 92 | Wt 198.0 lb

## 2016-12-03 DIAGNOSIS — Z113 Encounter for screening for infections with a predominantly sexual mode of transmission: Secondary | ICD-10-CM | POA: Diagnosis not present

## 2016-12-03 DIAGNOSIS — B9689 Other specified bacterial agents as the cause of diseases classified elsewhere: Secondary | ICD-10-CM | POA: Diagnosis not present

## 2016-12-03 DIAGNOSIS — Z98891 History of uterine scar from previous surgery: Secondary | ICD-10-CM | POA: Insufficient documentation

## 2016-12-03 DIAGNOSIS — O0990 Supervision of high risk pregnancy, unspecified, unspecified trimester: Secondary | ICD-10-CM

## 2016-12-03 DIAGNOSIS — N760 Acute vaginitis: Secondary | ICD-10-CM

## 2016-12-03 DIAGNOSIS — Z124 Encounter for screening for malignant neoplasm of cervix: Secondary | ICD-10-CM | POA: Diagnosis not present

## 2016-12-03 DIAGNOSIS — O0992 Supervision of high risk pregnancy, unspecified, second trimester: Secondary | ICD-10-CM

## 2016-12-03 DIAGNOSIS — O26899 Other specified pregnancy related conditions, unspecified trimester: Secondary | ICD-10-CM

## 2016-12-03 DIAGNOSIS — Z6791 Unspecified blood type, Rh negative: Secondary | ICD-10-CM

## 2016-12-03 HISTORY — DX: Supervision of high risk pregnancy, unspecified, unspecified trimester: O09.90

## 2016-12-03 MED ORDER — METRONIDAZOLE 500 MG PO TABS: 500.0000 mg | ORAL_TABLET | Freq: Two times a day (BID) | ORAL | 0 refills | Status: DC

## 2016-12-03 NOTE — Progress Notes (Signed)
    Subjective:    Morgan Lewis is a [id number] 6963w2d being seen today for her first obstetrical visit.  Her obstetrical history is significant for smoker and previous C-section x 2reports BV, not treated. Pregnancy history fully reviewed.  Patient reports no complaints.  Vitals:   12/03/16 0838  BP: 113/72  Pulse: 92  Weight: 198 lb (89.8 kg)    HISTORY: OB History  Gravida Para Term Preterm AB Living  3 2 2  0 0 2  SAB TAB Ectopic Multiple Live Births  0 0 0 0 2    # Outcome Date GA Lbr Len/2nd Weight Sex Delivery Anes PTL Lv  3 Current           2 Term 07/22/15 7138w3d  6 lb 14 oz (3.118 kg) M CS-LTranv     1 Term 03/15/13 7181w6d  7 lb 4 oz (3.289 kg) M CS-LTranv EPI  LIV     Past Medical History:  Diagnosis Date  . ADHD (attention deficit hyperactivity disorder) 04/17/2013  . 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 Medical history non-contributory   . Obesity    Past Surgical History:  Procedure Laterality Date  . CESAREAN SECTION N/A 03/15/2013   Procedure: CESAREAN SECTION;  Surgeon: Catalina AntiguaPeggy Constant, MD;  Location: [facility];  Service: Obstetrics;  Laterality: N/A;  . CHOLECYSTECTOMY N/A 04/16/2013   Procedure: LAPAROSCOPIC CHOLECYSTECTOMY WITH INTRAOPERATIVE CHOLANGIOGRAM;  Surgeon: Adolph Pollackodd J Rosenbower, MD;  Location: [facility];  Service: General;  Laterality: N/A;   Family History  Problem Relation Age of Onset  . Adopted: Yes  . Drug abuse Mother   . Drug abuse Father      Exam    Uterus:   24 wk size  Pelvic Exam:    Perineum: Normal Perineum   Vulva: Bartholin's, Urethra, Skene's normal   Vagina:  normal mucosa, normal discharge   Cervix: no lesions and nulliparous appearance   Adnexa: normal adnexa   Bony Pelvis: average  System: Breast:  normal appearance, no masses or tenderness   Skin: normal coloration and turgor, no rashes    Neurologic: normal   Extremities: normal strength, tone, and muscle mass   HEENT extra ocular  movement intact and sclera clear, anicteric   Mouth/Teeth mucous membranes moist, pharynx normal without lesions and dental hygiene good   Neck supple   Cardiovascular: regular rate and rhythm, no murmurs or gallops   Respiratory:  appears well, vitals normal, no respiratory distress, acyanotic, normal RR, ear and throat exam is normal, neck free of mass or lymphadenopathy, chest clear, no wheezing, crepitations, rhonchi, normal symmetric air entry   Abdomen: soft, non-tender; bowel sounds normal; no masses,  no organomegaly      Assessment/Plan:    Pregnancy: G3P2002   1. Supervision of high risk pregnancy, antepartum Labs--too late for genetic screens - Obstetric Panel, Including HIV - US MFM OB DETAIL +14 WK; Future - Cytology - PAP  2. Rh negative state in antepartum period Will need Rhogam at 28 wks  3. Bacterial vaginosis Rx sent to [facility] - metroNIDAZOLE (FLAGYL) 500 MG tablet; Take 1 tablet (500 mg total) by mouth 2 (two) times daily.  Dispense: 14 tablet; Refill: 0  4. Previous cesarean section Will need RLTCS  Return in 4 weeks (on 12/31/2016) for ob visit, 28 wk labs.    Reva Boresanya S Jerold Yoss 12/03/2016

## 2016-12-03 NOTE — Patient Instructions (Signed)
 Second Trimester of Pregnancy The second trimester is from week 14 through week 27 (months 4 through 6). The second trimester is often a time when you feel your best. Your body has adjusted to being pregnant, and you begin to feel better physically. Usually, morning sickness has lessened or quit completely, you may have more energy, and you may have an increase in appetite. The second trimester is also a time when the fetus is growing rapidly. At the end of the sixth month, the fetus is about 9 inches long and weighs about 1 pounds. You will likely begin to feel the baby move (quickening) between 16 and 20 weeks of pregnancy. Body changes during your second trimester Your body continues to go through many changes during your second trimester. The changes vary from woman to woman.  Your weight will continue to increase. You will notice your lower abdomen bulging out.  You may begin to get stretch marks on your hips, abdomen, and breasts.  You may develop headaches that can be relieved by medicines. The medicines should be approved by your health care provider.  You may urinate more often because the fetus is pressing on your bladder.  You may develop or continue to have heartburn as a result of your pregnancy.  You may develop constipation because certain hormones are causing the muscles that push waste through your intestines to slow down.  You may develop hemorrhoids or swollen, bulging veins (varicose veins).  You may have back pain. This is caused by: ? Weight gain. ? Pregnancy hormones that are relaxing the joints in your pelvis. ? A shift in weight and the muscles that support your balance.  Your breasts will continue to grow and they will continue to become tender.  Your gums may bleed and may be sensitive to brushing and flossing.  Dark spots or blotches (chloasma, mask of pregnancy) may develop on your face. This will likely fade after the baby is born.  A dark line from  your belly button to the pubic area (linea nigra) may appear. This will likely fade after the baby is born.  You may have changes in your hair. These can include thickening of your hair, rapid growth, and changes in texture. Some women also have hair loss during or after pregnancy, or hair that feels dry or thin. Your hair will most likely return to normal after your baby is born.  What to expect at prenatal visits During a routine prenatal visit:  You will be weighed to make sure you and the fetus are growing normally.  Your blood pressure will be taken.  Your abdomen will be measured to track your baby's growth.  The fetal heartbeat will be listened to.  Any test results from the previous visit will be discussed.  Your health care provider may ask you:  How you are feeling.  If you are feeling the baby move.  If you have had any abnormal symptoms, such as leaking fluid, bleeding, severe headaches, or abdominal cramping.  If you are using any tobacco products, including cigarettes, chewing tobacco, and electronic cigarettes.  If you have any questions.  Other tests that may be performed during your second trimester include:  Blood tests that check for: ? Low iron levels (anemia). ? High blood sugar that affects pregnant women (gestational diabetes) between 24 and 28 weeks. ? Rh antibodies. This is to check for a protein on red blood cells (Rh factor).  Urine tests to check for infections, diabetes,   or protein in the urine.  An ultrasound to confirm the proper growth and development of the baby.  An amniocentesis to check for possible genetic problems.  Fetal screens for spina bifida and Down syndrome.  HIV (human immunodeficiency virus) testing. Routine prenatal testing includes screening for HIV, unless you choose not to have this test.  Follow these instructions at home: Medicines  Follow your health care provider's instructions regarding medicine use. Specific  medicines may be either safe or unsafe to take during pregnancy.  Take a prenatal vitamin that contains at least 600 micrograms (mcg) of folic acid.  If you develop constipation, try taking a stool softener if your health care provider approves. Eating and drinking  Eat a balanced diet that includes fresh fruits and vegetables, whole grains, good sources of protein such as meat, eggs, or tofu, and low-fat dairy. Your health care provider will help you determine the amount of weight gain that is right for you.  Avoid raw meat and uncooked cheese. These carry germs that can cause birth defects in the baby.  If you have low calcium intake from food, talk to your health care provider about whether you should take a daily calcium supplement.  Limit foods that are high in fat and processed sugars, such as fried and sweet foods.  To prevent constipation: ? Drink enough fluid to keep your urine clear or pale yellow. ? Eat foods that are high in fiber, such as fresh fruits and vegetables, whole grains, and beans. Activity  Exercise only as directed by your health care provider. Most women can continue their usual exercise routine during pregnancy. Try to exercise for 30 minutes at least 5 days a week. Stop exercising if you experience uterine contractions.  Avoid heavy lifting, wear low heel shoes, and practice good posture.  A sexual relationship may be continued unless your health care provider directs you otherwise. Relieving pain and discomfort  Wear a good support bra to prevent discomfort from breast tenderness.  Take warm sitz baths to soothe any pain or discomfort caused by hemorrhoids. Use hemorrhoid cream if your health care provider approves.  Rest with your legs elevated if you have leg cramps or low back pain.  If you develop varicose veins, wear support hose. Elevate your feet for 15 minutes, 3-4 times a day. Limit salt in your diet. Prenatal Care  Write down your questions.  Take them to your prenatal visits.  Keep all your prenatal visits as told by your health care provider. This is important. Safety  Wear your seat belt at all times when driving.  Make a list of emergency phone numbers, including numbers for family, friends, the hospital, and police and fire departments. General instructions  Ask your health care provider for a referral to a local prenatal education class. Begin classes no later than the beginning of month 6 of your pregnancy.  Ask for help if you have counseling or nutritional needs during pregnancy. Your health care provider can offer advice or refer you to specialists for help with various needs.  Do not use hot tubs, steam rooms, or saunas.  Do not douche or use tampons or scented sanitary pads.  Do not cross your legs for long periods of time.  Avoid cat litter boxes and soil used by cats. These carry germs that can cause birth defects in the baby and possibly loss of the fetus by miscarriage or stillbirth.  Avoid all smoking, herbs, alcohol, and unprescribed drugs. Chemicals in these products   can affect the formation and growth of the baby.  Do not use any products that contain nicotine or tobacco, such as cigarettes and e-cigarettes. If you need help quitting, ask your health care provider.  Visit your dentist if you have not gone yet during your pregnancy. Use a soft toothbrush to brush your teeth and be gentle when you floss. Contact a health care provider if:  You have dizziness.  You have mild pelvic cramps, pelvic pressure, or nagging pain in the abdominal area.  You have persistent nausea, vomiting, or diarrhea.  You have a bad smelling vaginal discharge.  You have pain when you urinate. Get help right away if:  You have a fever.  You are leaking fluid from your vagina.  You have spotting or bleeding from your vagina.  You have severe abdominal cramping or pain.  You have rapid weight gain or weight  loss.  You have shortness of breath with chest pain.  You notice sudden or extreme swelling of your face, hands, ankles, feet, or legs.  You have not felt your baby move in over an hour.  You have severe headaches that do not go away when you take medicine.  You have vision changes. Summary  The second trimester is from week 14 through week 27 (months 4 through 6). It is also a time when the fetus is growing rapidly.  Your body goes through many changes during pregnancy. The changes vary from woman to woman.  Avoid all smoking, herbs, alcohol, and unprescribed drugs. These chemicals affect the formation and growth your baby.  Do not use any tobacco products, such as cigarettes, chewing tobacco, and e-cigarettes. If you need help quitting, ask your health care provider.  Contact your health care provider if you have any questions. Keep all prenatal visits as told by your health care provider. This is important. This information is not intended to replace advice given to you by your health care provider. Make sure you discuss any questions you have with your health care provider. Document Released: 03/17/2001 Document Revised: 08/29/2015 Document Reviewed: 05/24/2012 Elsevier Interactive Patient Education  2017 [employer]   Breastfeeding Deciding to breastfeed is one of the best choices you can make for you and your baby. A change in hormones during pregnancy causes your breast tissue to grow and increases the number and size of your milk ducts. These hormones also allow proteins, sugars, and fats from your blood supply to make breast milk in your milk-producing glands. Hormones prevent breast milk from being released before your baby is born as well as prompt milk flow after birth. Once breastfeeding has begun, thoughts of your baby, as well as his or her sucking or crying, can stimulate the release of milk from your milk-producing glands. Benefits of breastfeeding For Your  Baby  Your first milk (colostrum) helps your baby's digestive system function better.  There are antibodies in your milk that help your baby fight off infections.  Your baby has a lower incidence of asthma, allergies, and sudden infant death syndrome.  The nutrients in breast milk are better for your baby than infant formulas and are designed uniquely for your baby's needs.  Breast milk improves your baby's brain development.  Your baby is less likely to develop other conditions, such as childhood obesity, asthma, or type 2 diabetes mellitus.  For You  Breastfeeding helps to create a very special bond between you and your baby.  Breastfeeding is convenient. Breast milk is always available at   the correct temperature and costs nothing.  Breastfeeding helps to burn calories and helps you lose the weight gained during pregnancy.  Breastfeeding makes your uterus contract to its prepregnancy size faster and slows bleeding (lochia) after you give birth.  Breastfeeding helps to lower your risk of developing type 2 diabetes mellitus, osteoporosis, and breast or ovarian cancer later in life.  Signs that your baby is hungry Early Signs of Hunger  Increased alertness or activity.  Stretching.  Movement of the head from side to side.  Movement of the head and opening of the mouth when the corner of the mouth or cheek is stroked (rooting).  Increased sucking sounds, smacking lips, cooing, sighing, or squeaking.  Hand-to-mouth movements.  Increased sucking of fingers or hands.  Late Signs of Hunger  Fussing.  Intermittent crying.  Extreme Signs of Hunger Signs of extreme hunger will require calming and consoling before your baby will be able to breastfeed successfully. Do not wait for the following signs of extreme hunger to occur before you initiate breastfeeding:  Restlessness.  A loud, strong cry.  Screaming.  Breastfeeding basics Breastfeeding Initiation  Find a  comfortable place to sit or lie down, with your neck and back well supported.  Place a pillow or rolled up blanket under your baby to bring him or her to the level of your breast (if you are seated). Nursing pillows are specially designed to help support your arms and your baby while you breastfeed.  Make sure that your baby's abdomen is facing your abdomen.  Gently massage your breast. With your fingertips, massage from your chest wall toward your nipple in a circular motion. This encourages milk flow. You may need to continue this action during the feeding if your milk flows slowly.  Support your breast with 4 fingers underneath and your thumb above your nipple. Make sure your fingers are well away from your nipple and your baby's mouth.  Stroke your baby's lips gently with your finger or nipple.  When your baby's mouth is open wide enough, quickly bring your baby to your breast, placing your entire nipple and as much of the colored area around your nipple (areola) as possible into your baby's mouth. ? More areola should be visible above your baby's upper lip than below the lower lip. ? Your baby's tongue should be between his or her lower gum and your breast.  Ensure that your baby's mouth is correctly positioned around your nipple (latched). Your baby's lips should create a seal on your breast and be turned out (everted).  It is common for your baby to suck about 2-3 minutes in order to start the flow of breast milk.  Latching Teaching your baby how to latch on to your breast properly is very important. An improper latch can cause nipple pain and decreased milk supply for you and poor weight gain in your baby. Also, if your baby is not latched onto your nipple properly, he or she may swallow some air during feeding. This can make your baby fussy. Burping your baby when you switch breasts during the feeding can help to get rid of the air. However, teaching your baby to latch on properly is  still the best way to prevent fussiness from swallowing air while breastfeeding. Signs that your baby has successfully latched on to your nipple:  Silent tugging or silent sucking, without causing you pain.  Swallowing heard between every 3-4 sucks.  Muscle movement above and in front of his or her   ears while sucking.  Signs that your baby has not successfully latched on to nipple:  Sucking sounds or smacking sounds from your baby while breastfeeding.  Nipple pain.  If you think your baby has not latched on correctly, slip your finger into the corner of your baby's mouth to break the suction and place it between your baby's gums. Attempt breastfeeding initiation again. Signs of Successful Breastfeeding Signs from your baby:  A gradual decrease in the number of sucks or complete cessation of sucking.  Falling asleep.  Relaxation of his or her body.  Retention of a small amount of milk in his or her mouth.  Letting go of your breast by himself or herself.  Signs from you:  Breasts that have increased in firmness, weight, and size 1-3 hours after feeding.  Breasts that are softer immediately after breastfeeding.  Increased milk volume, as well as a change in milk consistency and color by the fifth day of breastfeeding.  Nipples that are not sore, cracked, or bleeding.  Signs That Your Baby is Getting Enough Milk  Wetting at least 1-2 diapers during the first 24 hours after birth.  Wetting at least 5-6 diapers every 24 hours for the first week after birth. The urine should be clear or pale yellow by 5 days after birth.  Wetting 6-8 diapers every 24 hours as your baby continues to grow and develop.  At least 3 stools in a 24-hour period by age 5 days. The stool should be soft and yellow.  At least 3 stools in a 24-hour period by age 7 days. The stool should be seedy and yellow.  No loss of weight greater than 10% of birth weight during the first 3 days of age.  Average  weight gain of 4-7 ounces (113-198 g) per week after age 4 days.  Consistent daily weight gain by age 5 days, without weight loss after the age of 2 weeks.  After a feeding, your baby may spit up a small amount. This is common. Breastfeeding frequency and duration Frequent feeding will help you make more milk and can prevent sore nipples and breast engorgement. Breastfeed when you feel the need to reduce the fullness of your breasts or when your baby shows signs of hunger. This is called "breastfeeding on demand." Avoid introducing a pacifier to your baby while you are working to establish breastfeeding (the first 4-6 weeks after your baby is born). After this time you may choose to use a pacifier. Research has shown that pacifier use during the first year of a baby's life decreases the risk of sudden infant death syndrome (SIDS). Allow your baby to feed on each breast as long as he or she wants. Breastfeed until your baby is finished feeding. When your baby unlatches or falls asleep while feeding from the first breast, offer the second breast. Because newborns are often sleepy in the first few weeks of life, you may need to awaken your baby to get him or her to feed. Breastfeeding times will vary from baby to baby. However, the following rules can serve as a guide to help you ensure that your baby is properly fed:  Newborns (babies 4 weeks of age or younger) may breastfeed every 1-3 hours.  Newborns should not go longer than 3 hours during the day or 5 hours during the night without breastfeeding.  You should breastfeed your baby a minimum of 8 times in a 24-hour period until you begin to introduce solid foods to your   baby at around 6 months of age.  Breast milk pumping Pumping and storing breast milk allows you to ensure that your baby is exclusively fed your breast milk, even at times when you are unable to breastfeed. This is especially important if you are going back to work while you are still  breastfeeding or when you are not able to be present during feedings. Your lactation consultant can give you guidelines on how long it is safe to store breast milk. A breast pump is a machine that allows you to pump milk from your breast into a sterile bottle. The pumped breast milk can then be stored in a refrigerator or freezer. Some breast pumps are operated by hand, while others use electricity. Ask your lactation consultant which type will work best for you. Breast pumps can be purchased, but some hospitals and breastfeeding support groups lease breast pumps on a monthly basis. A lactation consultant can teach you how to hand express breast milk, if you prefer not to use a pump. Caring for your breasts while you breastfeed Nipples can become dry, cracked, and sore while breastfeeding. The following recommendations can help keep your breasts moisturized and healthy:  Avoid using soap on your nipples.  Wear a supportive bra. Although not required, special nursing bras and tank tops are designed to allow access to your breasts for breastfeeding without taking off your entire bra or top. Avoid wearing underwire-style bras or extremely tight bras.  Air dry your nipples for 3-4minutes after each feeding.  Use only cotton bra pads to absorb leaked breast milk. Leaking of breast milk between feedings is normal.  Use lanolin on your nipples after breastfeeding. Lanolin helps to maintain your skin's normal moisture barrier. If you use pure lanolin, you do not need to wash it off before feeding your baby again. Pure lanolin is not toxic to your baby. You may also hand express a few drops of breast milk and gently massage that milk into your nipples and allow the milk to air dry.  In the first few weeks after giving birth, some women experience extremely full breasts (engorgement). Engorgement can make your breasts feel heavy, warm, and tender to the touch. Engorgement peaks within 3-5 days after you give  birth. The following recommendations can help ease engorgement:  Completely empty your breasts while breastfeeding or pumping. You may want to start by applying warm, moist heat (in the shower or with warm water-soaked hand towels) just before feeding or pumping. This increases circulation and helps the milk flow. If your baby does not completely empty your breasts while breastfeeding, pump any extra milk after he or she is finished.  Wear a snug bra (nursing or regular) or tank top for 1-2 days to signal your body to slightly decrease milk production.  Apply ice packs to your breasts, unless this is too uncomfortable for you.  Make sure that your baby is latched on and positioned properly while breastfeeding.  If engorgement persists after 48 hours of following these recommendations, contact your health care provider or a lactation consultant. Overall health care recommendations while breastfeeding  Eat healthy foods. Alternate between meals and snacks, eating 3 of each per day. Because what you eat affects your breast milk, some of the foods may make your baby more irritable than usual. Avoid eating these foods if you are sure that they are negatively affecting your baby.  Drink milk, fruit juice, and water to satisfy your thirst (about 10 glasses a day).    Rest often, relax, and continue to take your prenatal vitamins to prevent fatigue, stress, and anemia.  Continue breast self-awareness checks.  Avoid chewing and smoking tobacco. Chemicals from cigarettes that pass into breast milk and exposure to secondhand smoke may harm your baby.  Avoid alcohol and drug use, including marijuana. Some medicines that may be harmful to your baby can pass through breast milk. It is important to ask your health care provider before taking any medicine, including all over-the-counter and prescription medicine as well as vitamin and herbal supplements. It is possible to become pregnant while breastfeeding.  If birth control is desired, ask your health care provider about options that will be safe for your baby. Contact a health care provider if:  You feel like you want to stop breastfeeding or have become frustrated with breastfeeding.  You have painful breasts or nipples.  Your nipples are cracked or bleeding.  Your breasts are red, tender, or warm.  You have a swollen area on either breast.  You have a fever or chills.  You have nausea or vomiting.  You have drainage other than breast milk from your nipples.  Your breasts do not become full before feedings by the fifth day after you give birth.  You feel sad and depressed.  Your baby is too sleepy to eat well.  Your baby is having trouble sleeping.  Your baby is wetting less than 3 diapers in a 24-hour period.  Your baby has less than 3 stools in a 24-hour period.  Your baby's skin or the white part of his or her eyes becomes yellow.  Your baby is not gaining weight by 5 days of age. Get help right away if:  Your baby is overly tired (lethargic) and does not want to wake up and feed.  Your baby develops an unexplained fever. This information is not intended to replace advice given to you by your health care provider. Make sure you discuss any questions you have with your health care provider. Document Released: 03/23/2005 Document Revised: 09/04/2015 Document Reviewed: 09/14/2012 Elsevier Interactive Patient Education  2017 [employer]  

## 2016-12-04 LAB — OBSTETRIC PANEL, INCLUDING HIV
ANTIBODY SCREEN: NEGATIVE
BASOS: 0 %
Basophils Absolute: 0 10*3/uL (ref 0.0–0.2)
EOS (ABSOLUTE): 0.1 10*3/uL (ref 0.0–0.4)
Eos: 1 %
HEMATOCRIT: 32.4 % — AB (ref 34.0–46.6)
HIV SCREEN 4TH GENERATION: NONREACTIVE
Hemoglobin: 11.2 g/dL (ref 11.1–15.9)
Hepatitis B Surface Ag: NEGATIVE
IMMATURE GRANS (ABS): 0.1 10*3/uL (ref 0.0–0.1)
Immature Granulocytes: 1 %
LYMPHS ABS: 2.4 10*3/uL (ref 0.7–3.1)
LYMPHS: 27 %
MCH: 30.4 pg (ref 26.6–33.0)
MCHC: 34.6 g/dL (ref 31.5–35.7)
MCV: 88 fL (ref 79–97)
MONOS ABS: 0.6 10*3/uL (ref 0.1–0.9)
Monocytes: 7 %
NEUTROS ABS: 5.5 10*3/uL (ref 1.4–7.0)
Neutrophils: 64 %
Platelets: 319 10*3/uL (ref 150–379)
RBC: 3.69 x10E6/uL — AB (ref 3.77–5.28)
RDW: 13.5 % (ref 12.3–15.4)
RPR Ser Ql: NONREACTIVE
Rh Factor: NEGATIVE
Rubella Antibodies, IGG: 1.14 index (ref >0.99)
WBC: 8.6 10*3/uL (ref 3.4–10.8)

## 2016-12-08 LAB — CYTOLOGY - PAP
Adequacy: ABSENT
BACTERIAL VAGINITIS: NEGATIVE
CANDIDA VAGINITIS: NEGATIVE
Chlamydia: NEGATIVE
Diagnosis: NEGATIVE
Neisseria Gonorrhea: NEGATIVE
Trichomonas: NEGATIVE

## 2016-12-14 ENCOUNTER — Ambulatory Visit (HOSPITAL_COMMUNITY): Admission: RE | Admit: 2016-12-14 | Payer: Medicaid Other | Source: Ambulatory Visit

## 2016-12-31 ENCOUNTER — Encounter: Payer: Self-pay | Admitting: Obstetrics and Gynecology

## 2017-02-27 ENCOUNTER — Inpatient Hospital Stay (HOSPITAL_COMMUNITY)
Admission: AD | Admit: 2017-02-27 | Discharge: 2017-02-27 | Disposition: A | Discharge status: Home / Self Care | Payer: Medicaid Other | Source: Ambulatory Visit | Attending: Obstetrics and Gynecology | Admitting: Obstetrics and Gynecology

## 2017-02-27 ENCOUNTER — Encounter (HOSPITAL_COMMUNITY): Payer: Self-pay

## 2017-02-27 DIAGNOSIS — Z3A00 Weeks of gestation of pregnancy not specified: Secondary | ICD-10-CM | POA: Insufficient documentation

## 2017-02-27 DIAGNOSIS — O479 False labor, unspecified: Secondary | ICD-10-CM | POA: Diagnosis not present

## 2017-02-27 DIAGNOSIS — O0990 Supervision of high risk pregnancy, unspecified, unspecified trimester: Secondary | ICD-10-CM

## 2017-02-27 HISTORY — DX: Essential (primary) hypertension: I10

## 2017-02-27 HISTORY — DX: Generalized anxiety disorder: F41.1

## 2017-02-27 LAB — URINALYSIS, ROUTINE W REFLEX MICROSCOPIC
BILIRUBIN URINE: NEGATIVE
Glucose, UA: NEGATIVE mg/dL
HGB URINE DIPSTICK: NEGATIVE
Ketones, ur: NEGATIVE mg/dL
LEUKOCYTES UA: NEGATIVE
NITRITE: NEGATIVE
PROTEIN: 30 mg/dL — AB
SPECIFIC GRAVITY, URINE: 1.021 (ref 1.005–1.030)
pH: 7 (ref 5.0–8.0)

## 2017-02-27 NOTE — MAU Note (Signed)
+  contractions Denies LOF or VB +FM  No recent office visit per patient.

## 2017-02-27 NOTE — MAU Note (Signed)
I have communicated with [facility] [employer] and reviewed vital signs:  Vitals:   02/27/17 1425 02/27/17 1438  BP:  (!) 98/48  Pulse:  99  Resp:  16  Temp:    SpO2: 98%     Vaginal exam:  Dilation: Closed Effacement (%): Thick Cervical Position: Posterior Presentation: Undeterminable Exam by:: Janeth Rasehristina Robinson RNC,   Also reviewed contraction pattern and that non-stress test is reactive.  It has been documented that patient is not contracting and not dilated not indicating active labor.  Patient denies any other complaints.  Based on this report provider has given order for discharge.  A discharge order and diagnosis entered by a provider.   Labor discharge instructions reviewed with patient.

## 2017-02-27 NOTE — MAU Note (Signed)
Pt having ctx. No prenatal care. Previous c/s x2.

## 2017-02-27 NOTE — MAU Note (Signed)
Urine sent to lab 

## 2017-03-19 ENCOUNTER — Encounter: Payer: Medicaid Other | Admitting: Family Medicine

## 2017-03-19 ENCOUNTER — Encounter: Payer: Self-pay | Admitting: Family Medicine

## 2017-03-19 NOTE — Progress Notes (Signed)
Patient did not keep appointment today. She will be called to reschedule.  

## 2017-03-20 DIAGNOSIS — F29 Unspecified psychosis not due to a substance or known physiological condition: Secondary | ICD-10-CM | POA: Diagnosis not present

## 2017-03-23 ENCOUNTER — Other Ambulatory Visit: Payer: Self-pay

## 2017-03-23 ENCOUNTER — Encounter (HOSPITAL_COMMUNITY): Payer: Self-pay

## 2017-03-23 ENCOUNTER — Inpatient Hospital Stay (HOSPITAL_COMMUNITY)
Admission: AD | Admit: 2017-03-23 | Discharge: 2017-03-24 | Disposition: A | Discharge status: Home / Self Care | Payer: Medicaid Other | Source: Ambulatory Visit | Attending: Family Medicine | Admitting: Family Medicine

## 2017-03-23 DIAGNOSIS — F17210 Nicotine dependence, cigarettes, uncomplicated: Secondary | ICD-10-CM | POA: Insufficient documentation

## 2017-03-23 DIAGNOSIS — O99335 Smoking (tobacco) complicating the puerperium: Secondary | ICD-10-CM | POA: Diagnosis not present

## 2017-03-23 DIAGNOSIS — G8918 Other acute postprocedural pain: Secondary | ICD-10-CM

## 2017-03-23 DIAGNOSIS — O9089 Other complications of the puerperium, not elsewhere classified: Secondary | ICD-10-CM | POA: Insufficient documentation

## 2017-03-23 DIAGNOSIS — Z98891 History of uterine scar from previous surgery: Secondary | ICD-10-CM

## 2017-03-23 LAB — URINALYSIS, ROUTINE W REFLEX MICROSCOPIC
Bacteria, UA: NONE SEEN
Bilirubin Urine: NEGATIVE
Glucose, UA: NEGATIVE mg/dL
KETONES UR: NEGATIVE mg/dL
Nitrite: NEGATIVE
PH: 6 (ref 5.0–8.0)
Protein, ur: 30 mg/dL — AB
SPECIFIC GRAVITY, URINE: 1.027 (ref 1.005–1.030)

## 2017-03-23 MED ORDER — DOCUSATE SODIUM 100 MG PO CAPS
100.00 | ORAL_CAPSULE | ORAL | Status: DC
Start: 2017-03-22 — End: 2017-03-23

## 2017-03-23 MED ORDER — ACETAMINOPHEN 325 MG PO TABS
650.00 | ORAL_TABLET | ORAL | Status: DC
Start: ? — End: 2017-03-23

## 2017-03-23 MED ORDER — GENERIC EXTERNAL MEDICATION
Status: DC
Start: ? — End: 2017-03-23

## 2017-03-23 MED ORDER — PRENATAL VITAMINS 28-0.8 MG PO TABS
ORAL_TABLET | ORAL | Status: DC
Start: 2017-03-23 — End: 2017-03-23

## 2017-03-23 MED ORDER — IBUPROFEN 800 MG PO TABS
800.00 | ORAL_TABLET | ORAL | Status: DC
Start: 2017-03-22 — End: 2017-03-23

## 2017-03-23 MED ORDER — GUAIFENESIN 100 MG/5ML PO LIQD
200.00 | ORAL | Status: DC
Start: ? — End: 2017-03-23

## 2017-03-23 MED ORDER — BENZOCAINE-MENTHOL 15-3.6 MG MT LOZG
LOZENGE | OROMUCOSAL | Status: DC
Start: ? — End: 2017-03-23

## 2017-03-23 MED ORDER — BISACODYL 10 MG RE SUPP
10.00 | RECTAL | Status: DC
Start: ? — End: 2017-03-23

## 2017-03-23 MED ORDER — SALINE NASAL SPRAY 0.65 % NA SOLN
NASAL | Status: DC
Start: ? — End: 2017-03-23

## 2017-03-23 MED ORDER — MAGNESIUM HYDROXIDE 400 MG/5ML PO SUSP
30.00 | ORAL | Status: DC
Start: ? — End: 2017-03-23

## 2017-03-23 MED ORDER — SIMETHICONE 80 MG PO CHEW
80.00 | CHEWABLE_TABLET | ORAL | Status: DC
Start: ? — End: 2017-03-23

## 2017-03-23 MED ORDER — DIPHENHYDRAMINE HCL 25 MG PO CAPS
25.00 | ORAL_CAPSULE | ORAL | Status: DC
Start: ? — End: 2017-03-23

## 2017-03-23 MED ORDER — ANTACID & ANTIGAS 200-200-20 MG/5ML PO SUSP
30.00 | ORAL | Status: DC
Start: ? — End: 2017-03-23

## 2017-03-23 MED ORDER — OXYCODONE HCL 10 MG PO TABS
10.00 | ORAL_TABLET | ORAL | Status: DC
Start: ? — End: 2017-03-23

## 2017-03-23 MED ORDER — LANOLIN EX OINT
TOPICAL_OINTMENT | CUTANEOUS | Status: DC
Start: ? — End: 2017-03-23

## 2017-03-23 MED ORDER — BENZOCAINE-MENTHOL 20-0.5 % EX AERO
INHALATION_SPRAY | CUTANEOUS | Status: DC
Start: ? — End: 2017-03-23

## 2017-03-23 MED ORDER — QUETIAPINE FUMARATE 100 MG PO TABS
150.00 | ORAL_TABLET | ORAL | Status: DC
Start: ? — End: 2017-03-23

## 2017-03-23 MED ORDER — OXYCODONE HCL 5 MG PO TABS
5.00 | ORAL_TABLET | ORAL | Status: DC
Start: ? — End: 2017-03-23

## 2017-03-23 NOTE — MAU Note (Signed)
Was discharged [facility] yesterday - had a C/S.  Having incisional pain and back pain.  Looked in my bag and couldn't find my prescriptions, [facility] all [facility] and no one called me back.  Have a 22 year old at home and no help with him. Bleeding like a period.

## 2017-03-24 DIAGNOSIS — G8918 Other acute postprocedural pain: Secondary | ICD-10-CM

## 2017-03-24 MED ORDER — IBUPROFEN 800 MG PO TABS: 800.0000 mg | ORAL_TABLET | Freq: Three times a day (TID) | ORAL | 0 refills | Status: DC

## 2017-03-24 MED ORDER — KETOROLAC TROMETHAMINE 60 MG/2ML IM SOLN
60.0000 mg | Freq: Once | INTRAMUSCULAR | Status: AC
  Administered 2017-03-24: 60 mg via INTRAMUSCULAR
  Filled 2017-03-24: qty 2

## 2017-03-24 MED ORDER — CYCLOBENZAPRINE HCL 10 MG PO TABS
10.0000 mg | ORAL_TABLET | Freq: Once | ORAL | Status: AC
  Administered 2017-03-24: 10 mg via ORAL
  Filled 2017-03-24: qty 1

## 2017-03-24 MED ORDER — KETOROLAC TROMETHAMINE 30 MG/ML IJ SOLN
30.0000 mg | Freq: Once | INTRAMUSCULAR | Status: DC
  Filled 2017-03-24: qty 1

## 2017-03-24 MED ORDER — OXYCODONE-ACETAMINOPHEN 5-325 MG PO TABS: 1.0000 | ORAL_TABLET | Freq: Three times a day (TID) | ORAL | 0 refills | Status: DC | PRN

## 2017-03-24 NOTE — Discharge Instructions (Signed)

## 2017-03-24 NOTE — MAU Provider Note (Signed)
History  CSN: [id number] Arrival date and time: 03/23/17 2253  First Provider Initiated Contact with Patient 03/24/17 0011      Chief Complaint  Patient presents with  . Incisional Pain    HPI: Morgan Lewis is a 21 y.o. G3P3003 s/p C/S at [facility] 03/19/17 who presents to maternity admissions reporting severe pain, initially tells me that she hurts everywhere, but upon further questioning reports pain is mostly on her back and abdomen. She was discharged form the hospital yesterday, and reports she does not know if she got prescription for pain medications. She reports not remembering much from hospitalization, but cannot elaborate why. She also reports that she complained of this pain while in the hospital, but they discharged her anyways. She reports she does not know what paperwork she got from the hospital and reports trying to fill her prescriptions, but was unable to do so as she could not find them. Denies any other symptoms other than pain, including no fever, chills, malaise, nausea, vomiting, urinary symptoms, dizziness/lighreadhess, or headache.   Per EHR review thru Care Everywhere, pt with hx of bipolar disorder, and had postpartum course complicated by agitation and psychosis after C-section.   OB History  Gravida Para Term Preterm AB Living  3 3 3  0 0 2  SAB TAB Ectopic Multiple Live Births  0 0 0 0 2    # Outcome Date GA Lbr Len/2nd Weight Sex Delivery Anes PTL Lv  3 Term 03/19/17 5582w3d       LIV  2 Term 07/22/15 882w3d  6 lb 14 oz (3.118 kg) M CS-LTranv     1 Term 03/15/13 618w6d  7 lb 4 oz (3.289 kg) M CS-LTranv EPI  LIV     Past Medical History:  Diagnosis Date  . ADHD (attention deficit hyperactivity disorder) 04/17/2013  . 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 Generalized anxiety disorder   . Hypertension   . Obesity    Past Surgical History:  Procedure Laterality Date  . CESAREAN SECTION N/A 03/15/2013   Procedure: CESAREAN SECTION;  Surgeon: Catalina AntiguaPeggy Constant, MD;  Location: [facility];  Service: Obstetrics;  Laterality: N/A;  . CHOLECYSTECTOMY N/A 04/16/2013   Procedure: LAPAROSCOPIC CHOLECYSTECTOMY WITH INTRAOPERATIVE CHOLANGIOGRAM;  Surgeon: Adolph Pollackodd J Rosenbower, MD;  Location: [facility];  Service: General;  Laterality: N/A;   Social History   Socioeconomic History  . Marital status: Single    Spouse name: Not on file  . Number of children: Not on file  . Years of education: Not on file  . Highest education level: Not on file  Social Needs  . Financial resource strain: Not on file  . Food insecurity - worry: Not on file  . Food insecurity - inability: Not on file  . Transportation needs - medical: Not on file  . Transportation needs - non-medical: Not on file  Occupational History  . Occupation: Consulting civil engineertudent    Comment: 11th grade at [employer]  Tobacco Use  . Smoking status: Current Every Day Smoker    Types: Cigarettes  . Smokeless tobacco: Never Used  . Tobacco comment: black and mild  Substance and Sexual Activity  . Alcohol use: No    Comment: "maybe monthly"  . Drug use: No    Comment: when 4 mos pregnant  . Sexual activity: No    Partners: Male    Birth control/protection: None  Other Topics Concern  . Not on file  Social History Narrative  . Not  on file   No Known Allergies  Medications Prior to Admission  Medication Sig Dispense Refill Last Dose  . Prenatal Vit-Fe Fumarate-FA (PRENATAL VITAMINS) 28-0.8 MG TABS Take by mouth.   Taking    I have reviewed patient's Past Medical Hx, Surgical Hx, Family Hx, Social Hx, medications and allergies.   Review of Systems: Negative except for what is mentioned in HPI.  Physical Exam   Blood pressure 129/80, pulse 89, temperature 98.2 F (36.8 C), temperature source Oral, resp. rate 16, last menstrual period 06/16/2016, SpO2 99 %, unknown if currently breastfeeding.  Constitutional: Well-developed, well-nourished female in no acute  distress.  HENT: [place]/AT, normal oropharynx mucosa. MMM Eyes: normal conjunctivae, no scleral icterus Cardiovascular: normal rate, regular rhythm Respiratory: normal effort, lungs CTAB.  GI: Abd soft, non-distended, mild diffuse TTP, normoactive bowel sounds GU: Neg CVAT. MSK: Extremities nontender, no edema Neurologic: Alert and oriented x 4. Psych: Normal mood and affect Skin: warm and dry; LTCS scar with Steri Strips in place; healing well w/o erythema, drainage or increase in warmth    MAU Course/MDM:   Nursing notes and VS reviewed. Patient seen and examined, as noted above.  Toradol an flexeril given for pain.  Exam benign and nonfocal.  Pain improved with meds, patient would like to go home.   [place] Controlled Susbstance Database queried and patient has not filled any narcotic prescription.  Will write for Percocet 5mg  #10 in addition to ibuprofen.   Assessment and Plan  Assessment: 1. Postoperative pain   2. Status post cesarean delivery     Plan: --Rx for ibuprofen and Percocet 5 mg prn #10 --Discharge home in stable condition.  --Return precautions.   Degele, Kandra NicolasJulie P, MD 03/24/2017 12:33 AM

## 2018-01-06 DIAGNOSIS — Z79899 Other long term (current) drug therapy: Secondary | ICD-10-CM | POA: Diagnosis not present

## 2018-01-06 DIAGNOSIS — Z87891 Personal history of nicotine dependence: Secondary | ICD-10-CM | POA: Diagnosis not present

## 2018-01-06 DIAGNOSIS — L293 Anogenital pruritus, unspecified: Secondary | ICD-10-CM | POA: Diagnosis not present

## 2018-01-06 DIAGNOSIS — L292 Pruritus vulvae: Secondary | ICD-10-CM | POA: Diagnosis not present

## 2018-01-06 DIAGNOSIS — I10 Essential (primary) hypertension: Secondary | ICD-10-CM | POA: Diagnosis not present

## 2018-01-06 DIAGNOSIS — B373 Candidiasis of vulva and vagina: Secondary | ICD-10-CM | POA: Diagnosis not present

## 2018-06-20 ENCOUNTER — Encounter (HOSPITAL_COMMUNITY): Payer: Self-pay | Admitting: *Deleted

## 2018-06-20 ENCOUNTER — Inpatient Hospital Stay (HOSPITAL_COMMUNITY)
Admission: AD | Admit: 2018-06-20 | Discharge: 2018-06-20 | Disposition: A | Discharge status: Home / Self Care | Payer: Medicaid Other | Attending: Obstetrics and Gynecology | Admitting: Obstetrics and Gynecology

## 2018-06-20 ENCOUNTER — Other Ambulatory Visit: Payer: Self-pay

## 2018-06-20 DIAGNOSIS — Z3A01 Less than 8 weeks gestation of pregnancy: Secondary | ICD-10-CM | POA: Insufficient documentation

## 2018-06-20 DIAGNOSIS — G43709 Chronic migraine without aura, not intractable, without status migrainosus: Secondary | ICD-10-CM | POA: Insufficient documentation

## 2018-06-20 DIAGNOSIS — O99331 Smoking (tobacco) complicating pregnancy, first trimester: Secondary | ICD-10-CM | POA: Insufficient documentation

## 2018-06-20 DIAGNOSIS — O99351 Diseases of the nervous system complicating pregnancy, first trimester: Secondary | ICD-10-CM | POA: Insufficient documentation

## 2018-06-20 DIAGNOSIS — O26891 Other specified pregnancy related conditions, first trimester: Secondary | ICD-10-CM

## 2018-06-20 DIAGNOSIS — F17210 Nicotine dependence, cigarettes, uncomplicated: Secondary | ICD-10-CM | POA: Insufficient documentation

## 2018-06-20 DIAGNOSIS — G43719 Chronic migraine without aura, intractable, without status migrainosus: Secondary | ICD-10-CM

## 2018-06-20 HISTORY — DX: Headache, unspecified: R51.9

## 2018-06-20 HISTORY — DX: Headache: R51

## 2018-06-20 HISTORY — DX: Anemia, unspecified: D64.9

## 2018-06-20 LAB — URINALYSIS, ROUTINE W REFLEX MICROSCOPIC
Bilirubin Urine: NEGATIVE
Glucose, UA: NEGATIVE mg/dL
Hgb urine dipstick: NEGATIVE
Ketones, ur: NEGATIVE mg/dL
Nitrite: NEGATIVE
Protein, ur: 100 mg/dL — AB
SPECIFIC GRAVITY, URINE: 1.039 — AB (ref 1.005–1.030)
Squamous Epithelial / HPF: >50 — ABNORMAL HIGH (ref 0–5)
WBC, UA: >50 WBC/hpf — ABNORMAL HIGH (ref 0–5)
pH: 5 (ref 5.0–8.0)

## 2018-06-20 LAB — POCT PREGNANCY, URINE: Preg Test, Ur: POSITIVE — AB

## 2018-06-20 MED ORDER — METOCLOPRAMIDE HCL 5 MG/ML IJ SOLN: 10.0000 mg | Freq: Once | INTRAMUSCULAR | Status: DC

## 2018-06-20 MED ORDER — LACTATED RINGERS IV BOLUS: 1000.0000 mL | Freq: Once | INTRAVENOUS | Status: DC

## 2018-06-20 MED ORDER — DEXAMETHASONE SODIUM PHOSPHATE 10 MG/ML IJ SOLN: 10.0000 mg | Freq: Once | INTRAMUSCULAR | Status: DC

## 2018-06-20 MED ORDER — DIPHENHYDRAMINE HCL 50 MG/ML IJ SOLN: 25.0000 mg | Freq: Once | INTRAMUSCULAR | Status: DC

## 2018-06-20 NOTE — MAU Note (Signed)
Pt presents to MAU with complaints of fever, headache, and vomiting for a couple of days. States she went to [place] a couple of days ago and they did a CT scan of her head and then told her she was pregnant

## 2018-06-20 NOTE — MAU Provider Note (Signed)
History     CSN: [id number]  Arrival date and time: 06/20/18 1102   First Provider Initiated Contact with Patient 06/20/18 1142      Chief Complaint  Patient presents with  . Migraine  . Fever  . Emesis   HPI  Ms.  Morgan Lewis is a 23 y.o. year old G52P3002 female at [redacted]w[redacted]d weeks gestation who presents to MAU reporting H/A and N/V for "a couple of days". She states she went to the ED at [facility] and they did a CT scan on her, found out she was pregnant and wanted to do an ultrasound to r/o ectopic pregnancy on her; she declined the ultrasound. She received a Rx for medication for H/A, but is not sure what the name of it is. She reports that she doesn't;t trust what they did and wants to "get rid of this H/A."  Past Medical History:  Diagnosis Date  . ADHD (attention deficit hyperactivity disorder) 04/17/2013  . 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 Generalized anxiety disorder   . Hypertension   . Obesity     Past Surgical History:  Procedure Laterality Date  . CESAREAN SECTION N/A 03/15/2013   Procedure: CESAREAN SECTION;  Surgeon: Catalina Antigua, MD;  Location: [facility];  Service: Obstetrics;  Laterality: N/A;  . CHOLECYSTECTOMY N/A 04/16/2013   Procedure: LAPAROSCOPIC CHOLECYSTECTOMY WITH INTRAOPERATIVE CHOLANGIOGRAM;  Surgeon: Adolph Pollack, MD;  Location: [facility];  Service: General;  Laterality: N/A;    Family History  Adopted: Yes  Problem Relation Age of Onset  . Drug abuse Mother   . Drug abuse Father     Social History   Tobacco Use  . Smoking status: Current Every Day Smoker    Types: Cigarettes  . Smokeless tobacco: Never Used  . Tobacco comment: black and mild  Substance Use Topics  . Alcohol use: No    Comment: "maybe monthly"  . Drug use: No    Types: Marijuana    Comment: when 4 mos pregnant    Allergies: No Known Allergies  Medications Prior to Admission  Medication Sig Dispense Refill Last Dose  .  ibuprofen (ADVIL,MOTRIN) 800 MG tablet Take 1 tablet (800 mg total) by mouth every 8 (eight) hours. 60 tablet 0   . oxyCODONE-acetaminophen (ROXICET) 5-325 MG tablet Take 1-2 tablets by mouth every 8 (eight) hours as needed for severe pain. 10 tablet 0   . Prenatal Vit-Fe Fumarate-FA (PRENATAL VITAMINS) 28-0.8 MG TABS Take by mouth.   Taking    Review of Systems  Constitutional: Negative.   HENT: Negative.   Eyes: Positive for photophobia.  Respiratory: Negative.   Cardiovascular: Negative.   Gastrointestinal: Positive for nausea.  Endocrine: Negative.   Genitourinary: Negative.   Musculoskeletal: Negative.   Skin: Negative.   Allergic/Immunologic: Negative.   Neurological: Positive for headaches.  Hematological: Negative.   Psychiatric/Behavioral: Negative.    Physical Exam   Blood pressure 126/66, pulse 89, temperature 97.6 F (36.4 C), resp. rate 18, height 5\' 4"  (1.626 m), weight 83 kg, last menstrual period 05/21/2018, SpO2 100 %.  Physical Exam  Nursing note and vitals reviewed. Constitutional: She appears well-developed and well-nourished.  HENT:  Head: Normocephalic and atraumatic.  Eyes: Pupils are equal, round, and reactive to light.  Neck: Normal range of motion.  GI: Soft.  Neurological:  photophobia    MAU Course  Procedures  MDM CCUA Headache Cocktail -- offered, patient declined she "just wants to go home"  Results for orders  placed or performed during the hospital encounter of 06/20/18 (from the past 24 hour(s))  Pregnancy, urine POC     Status: Abnormal   Collection Time: 06/20/18 11:29 AM  Result Value Ref Range   Preg Test, Ur POSITIVE (A) NEGATIVE  Urinalysis, Routine w reflex microscopic     Status: Abnormal   Collection Time: 06/20/18 11:30 AM  Result Value Ref Range   Color, Urine AMBER (A) YELLOW   APPearance CLOUDY (A) CLEAR   Specific Gravity, Urine 1.039 (H) 1.005 - 1.030   pH 5.0 5.0 - 8.0   Glucose, UA NEGATIVE NEGATIVE mg/dL    Hgb urine dipstick NEGATIVE NEGATIVE   Bilirubin Urine NEGATIVE NEGATIVE   Ketones, ur NEGATIVE NEGATIVE mg/dL   Protein, ur 732 (A) NEGATIVE mg/dL   Nitrite NEGATIVE NEGATIVE   Leukocytes,Ua LARGE (A) NEGATIVE   RBC / HPF 21-50 0 - 5 RBC/hpf   WBC, UA >50 (H) 0 - 5 WBC/hpf   Bacteria, UA MANY (A) NONE SEEN   Squamous Epithelial / LPF >50 (H) 0 - 5   Mucus PRESENT     Assessment and Plan  Intractable chronic migraine without aura and without status migrainosus - Plan: Discharge patient - Advised to get the medication previously Rx'd using the GoodRx app - Patient left prior to receiving d/c instructions   Raelyn Mora, MSN, CNM 06/20/2018, 11:58 AM

## 2018-08-09 ENCOUNTER — Other Ambulatory Visit: Payer: Self-pay

## 2018-08-09 ENCOUNTER — Encounter (HOSPITAL_COMMUNITY): Payer: Self-pay | Admitting: *Deleted

## 2018-08-09 ENCOUNTER — Inpatient Hospital Stay (HOSPITAL_COMMUNITY)
Admission: AD | Admit: 2018-08-09 | Discharge: 2018-08-09 | Disposition: A | Discharge status: Home / Self Care | Payer: Medicaid Other | Attending: Obstetrics & Gynecology | Admitting: Obstetrics & Gynecology

## 2018-08-09 DIAGNOSIS — Z3A11 11 weeks gestation of pregnancy: Secondary | ICD-10-CM

## 2018-08-09 DIAGNOSIS — Z87891 Personal history of nicotine dependence: Secondary | ICD-10-CM | POA: Insufficient documentation

## 2018-08-09 DIAGNOSIS — B373 Candidiasis of vulva and vagina: Secondary | ICD-10-CM | POA: Diagnosis not present

## 2018-08-09 DIAGNOSIS — O98811 Other maternal infectious and parasitic diseases complicating pregnancy, first trimester: Secondary | ICD-10-CM | POA: Insufficient documentation

## 2018-08-09 DIAGNOSIS — O23591 Infection of other part of genital tract in pregnancy, first trimester: Secondary | ICD-10-CM

## 2018-08-09 DIAGNOSIS — R109 Unspecified abdominal pain: Secondary | ICD-10-CM | POA: Diagnosis not present

## 2018-08-09 DIAGNOSIS — O219 Vomiting of pregnancy, unspecified: Secondary | ICD-10-CM | POA: Insufficient documentation

## 2018-08-09 DIAGNOSIS — B3731 Acute candidiasis of vulva and vagina: Secondary | ICD-10-CM

## 2018-08-09 DIAGNOSIS — O26891 Other specified pregnancy related conditions, first trimester: Secondary | ICD-10-CM | POA: Insufficient documentation

## 2018-08-09 HISTORY — DX: Major depressive disorder, single episode, unspecified: F32.9

## 2018-08-09 HISTORY — DX: Depression, unspecified: F32.A

## 2018-08-09 LAB — URINALYSIS, ROUTINE W REFLEX MICROSCOPIC
Bilirubin Urine: NEGATIVE
Glucose, UA: NEGATIVE mg/dL
Hgb urine dipstick: NEGATIVE
Ketones, ur: NEGATIVE mg/dL
Leukocytes,Ua: NEGATIVE
Nitrite: NEGATIVE
Protein, ur: NEGATIVE mg/dL
Specific Gravity, Urine: 1.019 (ref 1.005–1.030)
pH: 8 (ref 5.0–8.0)

## 2018-08-09 LAB — WET PREP, GENITAL: Sperm: NONE SEEN

## 2018-08-09 MED ORDER — TERCONAZOLE 0.4 % VA CREA: 1.0000 | TOPICAL_CREAM | Freq: Every day | VAGINAL | 0 refills | Status: DC

## 2018-08-09 NOTE — MAU Note (Signed)
Having abd pain, sharp pains (above her vagina). Started a couple days ago. Doesn't know how far she is.  Has been trying to get into the dr.  Candis Lewis having these bad headaches, can't eat nothing. Had a little bleeding yesterday. Thinks she may be getting a yeast infection, some itching and irritation.  When pt was here in March, it was taking to long and she left.  Hx of irreg cycles since she had her c/s.

## 2018-08-09 NOTE — MAU Provider Note (Signed)
History     CSN: [id number]  Arrival date and time: 08/09/18 1138   First Provider Initiated Contact with Patient 08/09/18 1226      Chief Complaint  Patient presents with  . Abdominal Pain   G4P3003 @11 .3 wks presenting with abdominal pain. Pain has been ongoing for weeks but worse over the last 2 days. Pain is located in different areas, at times in RUQ and other times in SP region. Denies fevers. No constipation or diarrhea. Having emesis is the mornings. No urinary sx. No Vb but reports vaginal irritation and discharge. No new partner or concern for STDs. Has not started care, wants to make sure her baby ok. Is undecided where to go for care.  OB History    Gravida  4   Para  3   Term  3   Preterm  0   AB  0   Living  3     SAB  0   TAB  0   Ectopic  0   Multiple  0   Live Births  3           Past Medical History:  Diagnosis Date  . ADHD (attention deficit hyperactivity disorder) 04/17/2013  . Anemia   . 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 Depression    has never taken meds  . Generalized anxiety disorder   . Headache   . Hypertension   . Obesity     Past Surgical History:  Procedure Laterality Date  . CESAREAN SECTION N/A 03/15/2013   Procedure: CESAREAN SECTION;  Surgeon: Catalina Antigua, MD;  Location: [facility];  Service: Obstetrics;  Laterality: N/A;  . CHOLECYSTECTOMY N/A 04/16/2013   Procedure: LAPAROSCOPIC CHOLECYSTECTOMY WITH INTRAOPERATIVE CHOLANGIOGRAM;  Surgeon: Adolph Pollack, MD;  Location: [facility];  Service: General;  Laterality: N/A;    Family History  Adopted: Yes  Problem Relation Age of Onset  . Drug abuse Mother   . Drug abuse Father     Social History   Tobacco Use  . Smoking status: Former Smoker    Types: Cigars  . Smokeless tobacco: Never Used  . Tobacco comment: black and mild quit 2018  Substance Use Topics  . Alcohol use: Not Currently  . Drug use: Yes    Types: Marijuana   Comment: couple days ago    Allergies: No Known Allergies  No medications prior to admission.    Review of Systems  Constitutional: Negative for chills and fever.  Gastrointestinal: Positive for abdominal pain, nausea and vomiting. Negative for constipation and diarrhea.  Genitourinary: Positive for vaginal discharge. Negative for dysuria and vaginal bleeding.  Musculoskeletal: Negative for back pain.   Physical Exam   Blood pressure (!) 103/48, pulse 70, temperature (!) 97.4 F (36.3 C), temperature source Oral, resp. rate 18, weight 85.5 kg, last menstrual period 05/21/2018, SpO2 100 %, unknown if currently breastfeeding.  Physical Exam  Nursing note and vitals reviewed. Constitutional: She is oriented to person, place, and time. She appears well-developed and well-nourished. No distress.  HENT:  Head: Normocephalic and atraumatic.  Neck: Normal range of motion.  Cardiovascular: Normal rate.  Respiratory: Effort normal. No respiratory distress.  GI: Soft. She exhibits no distension and no mass. There is no abdominal tenderness. There is no rebound and no guarding.  Genitourinary:    Genitourinary Comments: External: no lesions or erythema Vagina: rugated, pink, moist, moderate thick white adherent discharge Uterus: + enlarged, anteverted, non tender, no CMT Adnexae: no  masses, no tenderness left, no tenderness right Cervix closed, normal    Musculoskeletal: Normal range of motion.  Neurological: She is alert and oriented to person, place, and time.  Skin: Skin is warm and dry.  Psychiatric: She has a normal mood and affect.  FHT 156  Results for orders placed or performed during the hospital encounter of 08/09/18 (from the past 24 hour(s))  Urinalysis, Routine w reflex microscopic     Status: None   Collection Time: 08/09/18 11:55 AM  Result Value Ref Range   Color, Urine YELLOW YELLOW   APPearance CLEAR CLEAR   Specific Gravity, Urine 1.019 1.005 - 1.030   pH 8.0  5.0 - 8.0   Glucose, UA NEGATIVE NEGATIVE mg/dL   Hgb urine dipstick NEGATIVE NEGATIVE   Bilirubin Urine NEGATIVE NEGATIVE   Ketones, ur NEGATIVE NEGATIVE mg/dL   Protein, ur NEGATIVE NEGATIVE mg/dL   Nitrite NEGATIVE NEGATIVE   Leukocytes,Ua NEGATIVE NEGATIVE   Wet prep: +yeast, no clue, no trich, many WBC  MAU Course  Procedures  MDM Labs ordered and reviewed. No evidence of acute abdominal process. Will treat yeast. Stable for discharge home.  Assessment and Plan   1. [redacted] weeks gestation of pregnancy   2. Yeast vaginitis    Discharge home Follow up with OBGYN provider of choice SAB precautions Rx Terazol  Allergies as of 08/09/2018   No Known Allergies     Medication List    STOP taking these medications   ibuprofen 800 MG tablet Commonly known as:  ADVIL   oxyCODONE-acetaminophen 5-325 MG tablet Commonly known as:  Roxicet     TAKE these medications   Prenatal Vitamins 28-0.8 MG Tabs Take by mouth.   terconazole 0.4 % vaginal cream Commonly known as:  TERAZOL 7 Place 1 applicator vaginally at bedtime.      Donette LarryMelanie Rinda Rollyson, CNM 08/09/2018, 1:49 PM

## 2018-08-09 NOTE — Discharge Instructions (Signed)
Vaginal Yeast infection, Adult  Vaginal yeast infection is a condition that causes vaginal discharge as well as soreness, swelling, and redness (inflammation) of the vagina. This is a common condition. Some women get this infection frequently. What are the causes? This condition is caused by a change in the normal balance of the yeast (candida) and bacteria that live in the vagina. This change causes an overgrowth of yeast, which causes the inflammation. What increases the risk? The condition is more likely to develop in women who:  Take antibiotic medicines.  Have diabetes.  Take birth control pills.  Are pregnant.  Douche often.  Have a weak body defense system (immune system).  Have been taking steroid medicines for a long time.  Frequently wear tight clothing. What are the signs or symptoms? Symptoms of this condition include:  White, thick, creamy vaginal discharge.  Swelling, itching, redness, and irritation of the vagina. The lips of the vagina (vulva) may be affected as well.  Pain or a burning feeling while urinating.  Pain during sex. How is this diagnosed? This condition is diagnosed based on:  Your medical history.  A physical exam.  A pelvic exam. Your health care provider will examine a sample of your vaginal discharge under a microscope. Your health care provider may send this sample for testing to confirm the diagnosis. How is this treated? This condition is treated with medicine. Medicines may be over-the-counter or prescription. You may be told to use one or more of the following:  Medicine that is taken by mouth (orally).  Medicine that is applied as a cream (topically).  Medicine that is inserted directly into the vagina (suppository). Follow these instructions at home:  Lifestyle  Do not have sex until your health care provider approves. Tell your sex partner that you have a yeast infection. That person should go to his or her health care  provider and ask if they should also be treated.  Do not wear tight clothes, such as pantyhose or tight pants.  Wear breathable cotton underwear. General instructions  Take or apply over-the-counter and prescription medicines only as told by your health care provider.  Eat more yogurt. This may help to keep your yeast infection from returning.  Do not use tampons until your health care provider approves.  Try taking a sitz bath to help with discomfort. This is a warm water bath that is taken while you are sitting down. The water should only come up to your hips and should cover your buttocks. Do this 3-4 times per day or as told by your health care provider.  Do not douche.  If you have diabetes, keep your blood sugar levels under control.  Keep all follow-up visits as told by your health care provider. This is important. Contact a health care provider if:  You have a fever.  Your symptoms go away and then return.  Your symptoms do not get better with treatment.  Your symptoms get worse.  You have new symptoms.  You develop blisters in or around your vagina.  You have blood coming from your vagina and it is not your menstrual period.  You develop pain in your abdomen. Summary  Vaginal yeast infection is a condition that causes discharge as well as soreness, swelling, and redness (inflammation) of the vagina.  This condition is treated with medicine. Medicines may be over-the-counter or prescription.  Take or apply over-the-counter and prescription medicines only as told by your health care provider.  Do not douche.  Do not have sex or use tampons until your health care provider approves.  Contact a health care provider if your symptoms do not get better with treatment or your symptoms go away and then return. This information is not intended to replace advice given to you by your health care provider. Make sure you discuss any questions you have with your health care  provider. Document Released: 12/31/2004 Document Revised: 08/09/2017 Document Reviewed: 08/09/2017 Elsevier Interactive Patient Education  2019 [employer]vier Inc[employer]  [place] [facility] Providers    [facility] [employer] [facility]       Phone: [phone]  [facility] [employer] at [place]   Phone: [phone]  [facility] [employer] at [place]  Phone: [phone]  [facility] for [facility] at [facility]  Phone: [phone]  [facility] for [facility] at [place]  Phone: [phone]  [facility] at [facility]   Phone: [phone]  [facility]       Phone: [phone]  [facility]    Phone: [phone]   [facility]    Phone: [phone]  [facility]    Phone: [phone]  [facility]    Phone: [phone]  [facility]       Phone: [phone]   [facility]  Phone: [phone]  [facility]    Phone: [phone]  [facility]   Phone: [phone]  [facility]      Phone: [phone]  [facility]    Phone: [phone]

## 2018-08-10 LAB — GC/CHLAMYDIA PROBE AMP (~~LOC~~) NOT AT ARMC
Chlamydia: NEGATIVE
Neisseria Gonorrhea: NEGATIVE

## 2018-08-23 ENCOUNTER — Encounter: Payer: Medicaid Other | Admitting: Advanced Practice Midwife

## 2018-10-18 DIAGNOSIS — B9689 Other specified bacterial agents as the cause of diseases classified elsewhere: Secondary | ICD-10-CM | POA: Diagnosis not present

## 2018-10-18 DIAGNOSIS — Z3A21 21 weeks gestation of pregnancy: Secondary | ICD-10-CM | POA: Diagnosis not present

## 2018-10-18 DIAGNOSIS — Z87891 Personal history of nicotine dependence: Secondary | ICD-10-CM | POA: Diagnosis not present

## 2018-10-18 DIAGNOSIS — O162 Unspecified maternal hypertension, second trimester: Secondary | ICD-10-CM | POA: Diagnosis not present

## 2018-10-18 DIAGNOSIS — O9A212 Injury, poisoning and certain other consequences of external causes complicating pregnancy, second trimester: Secondary | ICD-10-CM | POA: Diagnosis not present

## 2018-10-18 DIAGNOSIS — S39012A Strain of muscle, fascia and tendon of lower back, initial encounter: Secondary | ICD-10-CM | POA: Diagnosis not present

## 2018-10-18 DIAGNOSIS — Z79899 Other long term (current) drug therapy: Secondary | ICD-10-CM | POA: Diagnosis not present

## 2018-10-18 DIAGNOSIS — O23592 Infection of other part of genital tract in pregnancy, second trimester: Secondary | ICD-10-CM | POA: Diagnosis not present

## 2018-10-20 ENCOUNTER — Telehealth: Payer: Self-pay | Admitting: *Deleted

## 2018-10-20 NOTE — Telephone Encounter (Signed)
Left patient a message that she will not have an in office ultrasound due to her GA. She will be scheduled for an anatomy ultrasound after heart tones are established in the office at [facility] at first avalibility there.

## 2018-10-25 ENCOUNTER — Encounter: Payer: Self-pay | Admitting: *Deleted

## 2018-10-25 ENCOUNTER — Other Ambulatory Visit: Payer: Self-pay

## 2018-10-25 ENCOUNTER — Ambulatory Visit (INDEPENDENT_AMBULATORY_CARE_PROVIDER_SITE_OTHER): Payer: Medicaid Other | Admitting: Certified Nurse Midwife

## 2018-10-25 ENCOUNTER — Other Ambulatory Visit (HOSPITAL_COMMUNITY)
Admission: RE | Admit: 2018-10-25 | Discharge: 2018-10-25 | Disposition: A | Discharge status: Home / Self Care | Payer: Medicaid Other | Source: Ambulatory Visit | Attending: Advanced Practice Midwife | Admitting: Advanced Practice Midwife

## 2018-10-25 ENCOUNTER — Other Ambulatory Visit: Payer: Medicaid Other

## 2018-10-25 ENCOUNTER — Encounter: Payer: Self-pay | Admitting: Certified Nurse Midwife

## 2018-10-25 VITALS — BP 110/70 | HR 79 | Wt 193.0 lb

## 2018-10-25 DIAGNOSIS — O360120 Maternal care for anti-D [Rh] antibodies, second trimester, not applicable or unspecified: Secondary | ICD-10-CM | POA: Diagnosis not present

## 2018-10-25 DIAGNOSIS — O26892 Other specified pregnancy related conditions, second trimester: Secondary | ICD-10-CM

## 2018-10-25 DIAGNOSIS — Z3480 Encounter for supervision of other normal pregnancy, unspecified trimester: Secondary | ICD-10-CM | POA: Insufficient documentation

## 2018-10-25 DIAGNOSIS — Z3482 Encounter for supervision of other normal pregnancy, second trimester: Secondary | ICD-10-CM | POA: Diagnosis not present

## 2018-10-25 DIAGNOSIS — Z6791 Unspecified blood type, Rh negative: Secondary | ICD-10-CM

## 2018-10-25 DIAGNOSIS — O4690 Antepartum hemorrhage, unspecified, unspecified trimester: Secondary | ICD-10-CM

## 2018-10-25 DIAGNOSIS — Z98891 History of uterine scar from previous surgery: Secondary | ICD-10-CM | POA: Diagnosis not present

## 2018-10-25 DIAGNOSIS — O4692 Antepartum hemorrhage, unspecified, second trimester: Secondary | ICD-10-CM | POA: Diagnosis not present

## 2018-10-25 DIAGNOSIS — O0932 Supervision of pregnancy with insufficient antenatal care, second trimester: Secondary | ICD-10-CM | POA: Diagnosis not present

## 2018-10-25 DIAGNOSIS — Z3A22 22 weeks gestation of pregnancy: Secondary | ICD-10-CM | POA: Diagnosis not present

## 2018-10-25 MED ORDER — METRONIDAZOLE 500 MG PO TABS: 500.0000 mg | ORAL_TABLET | Freq: Two times a day (BID) | ORAL | 0 refills | Status: DC

## 2018-10-25 MED ORDER — BLOOD PRESSURE MONITORING KIT: 1.0000 | PACK | 0 refills | Status: AC

## 2018-10-25 MED ORDER — RHO D IMMUNE GLOBULIN 1500 UNIT/2ML IJ SOSY
300.0000 ug | PREFILLED_SYRINGE | Freq: Once | INTRAMUSCULAR | Status: AC
  Administered 2018-10-25: 300 ug via INTRAMUSCULAR

## 2018-10-25 NOTE — Patient Instructions (Signed)
Glucose Tolerance Test During Pregnancy Why am I having this test? The glucose tolerance test (GTT) is done to check how your body processes sugar (glucose). This is one of several tests used to diagnose diabetes that develops during pregnancy (gestational diabetes mellitus). Gestational diabetes is a temporary form of diabetes that some women develop during pregnancy. It usually occurs during the second trimester of pregnancy and goes away after delivery. Testing (screening) for gestational diabetes usually occurs between 24 and 28 weeks of pregnancy. You may have the GTT test after having a 1-hour glucose screening test if the results from that test indicate that you may have gestational diabetes. You may also have this test if:  You have a history of gestational diabetes.  You have a history of giving birth to very large babies or have experienced repeated fetal loss (stillbirth).  You have signs and symptoms of diabetes, such as: ? Changes in your vision. ? Tingling or numbness in your hands or feet. ? Changes in hunger, thirst, and urination that are not otherwise explained by your pregnancy. What is being tested? This test measures the amount of glucose in your blood at different times during a period of 3 hours. This indicates how well your body is able to process glucose. What kind of sample is taken?  Blood samples are required for this test. They are usually collected by inserting a needle into a blood vessel. How do I prepare for this test?  For 3 days before your test, eat normally. Have plenty of carbohydrate-rich foods.  Follow instructions from your health care provider about: ? Eating or drinking restrictions on the day of the test. You may be asked to not eat or drink anything other than water (fast) starting 8-10 hours before the test. ? Changing or stopping your regular medicines. Some medicines may interfere with this test. Tell a health care provider about:  All  medicines you are taking, including vitamins, herbs, eye drops, creams, and over-the-counter medicines.  Any blood disorders you have.  Any surgeries you have had.  Any medical conditions you have. What happens during the test? First, your blood glucose will be measured. This is referred to as your fasting blood glucose, since you fasted before the test. Then, you will drink a glucose solution that contains a certain amount of glucose. Your blood glucose will be measured again 1, 2, and 3 hours after drinking the solution. This test takes about 3 hours to complete. You will need to stay at the testing location during this time. During the testing period:  Do not eat or drink anything other than the glucose solution.  Do not exercise.  Do not use any products that contain nicotine or tobacco, such as cigarettes and e-cigarettes. If you need help stopping, ask your health care provider. The testing procedure may vary among health care providers and hospitals. How are the results reported? Your results will be reported as milligrams of glucose per deciliter of blood (mg/dL) or millimoles per liter (mmol/L). Your health care provider will compare your results to normal ranges that were established after testing a large group of people (reference ranges). Reference ranges may vary among labs and hospitals. For this test, common reference ranges are:  Fasting: less than 95-105 mg/dL (5.3-5.8 mmol/L).  1 hour after drinking glucose: less than 180-190 mg/dL (10.0-10.5 mmol/L).  2 hours after drinking glucose: less than 155-165 mg/dL (8.6-9.2 mmol/L).  3 hours after drinking glucose: 140-145 mg/dL (7.8-8.1 mmol/L). What do the   results mean? Results within reference ranges are considered normal, meaning that your glucose levels are well-controlled. If two or more of your blood glucose levels are high, you may be diagnosed with gestational diabetes. If only one level is high, your health care  provider may suggest repeat testing or other tests to confirm a diagnosis. Talk with your health care provider about what your results mean. Questions to ask your health care provider Ask your health care provider, or the department that is doing the test:  When will my results be ready?  How will I get my results?  What are my treatment options?  What other tests do I need?  What are my next steps? Summary  The glucose tolerance test (GTT) is one of several tests used to diagnose diabetes that develops during pregnancy (gestational diabetes mellitus). Gestational diabetes is a temporary form of diabetes that some women develop during pregnancy.  You may have the GTT test after having a 1-hour glucose screening test if the results from that test indicate that you may have gestational diabetes. You may also have this test if you have any symptoms or risk factors for gestational diabetes.  Talk with your health care provider about what your results mean. This information is not intended to replace advice given to you by your health care provider. Make sure you discuss any questions you have with your health care provider. Document Released: 09/22/2011 Document Revised: 07/14/2018 Document Reviewed: 11/02/2016 Elsevier Patient Education  2020 Elsevier Inc.  

## 2018-10-26 ENCOUNTER — Telehealth: Payer: Self-pay | Admitting: *Deleted

## 2018-10-26 DIAGNOSIS — O0932 Supervision of pregnancy with insufficient antenatal care, second trimester: Secondary | ICD-10-CM | POA: Insufficient documentation

## 2018-10-26 NOTE — Progress Notes (Signed)
History:   Morgan Lewis is a 23 y.o. [phone] at 69w3dby LMP being seen today for her first obstetrical visit.  Her obstetrical history is significant for late prenatal care, hx of C/S, and Rh negative . Patient does intend to breast feed. Pregnancy history fully reviewed.  Patient reports vaginal spotting.     HISTORY: OB History  Gravida Para Term Preterm AB Living  4 3 3  0 0 3  SAB TAB Ectopic Multiple Live Births  0 0 0 0 3    # Outcome Date GA Lbr Len/2nd Weight Sex Delivery Anes PTL Lv  4 Current           3 Term 03/19/17 370w3d M CS-Unspec  N LIV  2 Term 07/22/15 3915w3d lb 14 oz (3.118 kg) M CS-LTranv  N LIV  1 Term 03/15/13 40w48w6dlb 4 oz (3.289 kg) M CS-LTranv EPI  LIV     Name: Morgan Lewis Apgar1: 8  Apgar5: 9    Last pap smear was done 11/2016 and was normal- needs PP   Past Medical History:  Diagnosis Date  . ADHD (attention deficit hyperactivity disorder) 04/17/2013  . Anemia   . 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 Depression    has never taken meds  . Generalized anxiety disorder   . Headache   . Hypertension   . Obesity   . Supervision of high risk pregnancy, antepartum 12/03/2016    [facility]ating  by lmp 03/23/17  Blood type: O/Negative/-- (08/30 0920)  Genetic Screen Too late Antibody:Negative (08/30 0920) Anatomic US  [place]: 1.14 (08/30 0920) GTT Third trimester:  RPR: Non Reactive (08/30 0920)  Flu vaccine  HBsAg: Negative (08/30 0920)  TDaP vaccine                                              HIV:   Negative Baby Food   undecided                    Past Surgical History:  Procedure Laterality Date  . CESAREAN SECTION N/A 03/15/2013   Procedure: CESAREAN SECTION;  Surgeon: PeggMora Lewis;  [facility];  Service: Obstetrics;  Laterality: N/A;  . CHOLECYSTECTOMY N/A 04/16/2013   Procedure: LAPAROSCOPIC CHOLECYSTECTOMY WITH INTRAOPERATIVE CHOLANGIOGRAM;  Surgeon: ToddOdis Lewis;  Location: [facility];  Service: General;  Laterality: N/A;   Family History  Adopted: Yes  Problem Relation Age of Onset  . Drug abuse Mother   . Drug abuse Father    Social History   Tobacco Use  . Smoking status: Former Smoker    Types: Cigars  . Smokeless tobacco: Never Used  . Tobacco comment: black and mild quit 2018  Substance Use Topics  . Alcohol use: Not Currently  . Drug use: Yes    Types: Marijuana    Comment: couple days ago   No Known Allergies No current outpatient medications on file prior to visit.   No current facility-administered medications on file prior to visit.     Review of Systems Pertinent items noted in HPI and remainder of comprehensive ROS otherwise negative. Physical Exam:   Vitals:   10/25/18 1025  BP: 110/70  Pulse: 79  Weight: 193 lb (87.5 kg)  Fetal Heart Rate (bpm): 152 Uterus:  Fundal Height: 20 cm  Pelvic Exam: Perineum: no hemorrhoids, normal perineum   Vulva: normal external genitalia, no lesions   Vagina:  normal mucosa, light pink vaginal spotting present   Cervix: no lesions and normal, visually closed    Adnexa: normal adnexa and no mass, fullness, tenderness   Bony Pelvis: average  System: General: well-developed, well-nourished female in no acute distress   Breasts:  normal appearance, no masses or tenderness bilaterally   Skin: normal coloration and turgor, no rashes   Neurologic: oriented, normal, negative, normal mood   Extremities: normal strength, tone, and muscle mass, ROM of all joints is normal   HEENT PERRLA, extraocular movement intact and sclera clear   Mouth/Teeth mucous membranes moist, pharynx normal without lesions and dental hygiene good   Neck supple and no masses   Cardiovascular: regular rate and rhythm   Respiratory:  no respiratory distress, normal breath sounds   Abdomen: soft, non-tender; bowel sounds normal; no organomegaly, gravid appropriate for gestational age   Assessment:     Pregnancy: G4P3003 Patient Active Problem List   Diagnosis Date Noted  . Supervision of other normal pregnancy, antepartum 10/25/2018  . Previous cesarean section 12/03/2016  . ADHD (attention deficit hyperactivity disorder) 04/17/2013  . Cholecystitis 04/15/2013  . Rh negative state in antepartum period 01/25/2013  . Mental disorders of mother, antepartum 11/08/2012  . Generalized anxiety disorder 08/27/2011  . 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 Depression, major, single episode, moderate ([place]) 08/24/2011  . Oppositional defiant disorder 08/24/2011     Plan:    1. Supervision of other normal pregnancy, antepartum - Late prenatal care and hx of polysubstance abuse, patient denies current use  - States she wants to be seen at [facility] d/t living in [place] and it being easier access for her -  Discussed options of birth control with patient, plans BTL with repeat C/S- consent signed today  - Urine drugs of abuse scrn w alc, routine ([facility]) - Obstetric Panel, Including HIV - Cervicovaginal ancillary only( [place]) - [place] MFM OB DETAIL +14 [place]; Future - Culture, OB Urine - Varicella zoster antibody, IgG - Babyscripts Schedule Optimization - Blood Pressure Monitoring KIT; 1 kit by Does not apply route once a week.  Dispense: 1 kit; Refill: 0  2. Previous cesarean section - Hx of C/S x3, need repeat   3. Rh negative state in antepartum period - Patient reports being seen at [facility] last week for vaginal bleeding, denies being given Rhogam at visit but was sent Flagyl for BV  - Rhogam given today in office for continued vaginal bleeding   4. Late prenatal care affecting pregnancy in second trimester - Prenatal care initiated at [redacted]w[redacted]d - [place] OB DETAIL +14 [place]; Future  5. Vaginal bleeding during pregnancy, antepartum - [facility], denies being given Rhogam  at visit but was sent Flagyl for BV  - Patient reports not picking up Flagyl d/t price, resent Rx to different pharmacy, encouraged patient to take medication and abstain from IC until 7 days after cessation of bleeding.  - rho (d) immune globulin (RHIG/RHOPHYLAC) injection 300 mcg - metroNIDAZOLE (FLAGYL) 500 MG tablet; Take 1 tablet (500 mg total) by mouth 2 (two) times daily.  Dispense: 14 tablet; Refill: 0   Initial labs drawn. Rhogam given today  BTL consent signed today  Continue prenatal vitamins.  Genetic Screening discussed, NIPS: ordered. Ultrasound discussed; fetal anatomic survey: ordered. Problem list reviewed and updated. The nature of [place] with multiple MDs and other Advanced Practice Providers was explained to patient; also emphasized that residents, students are part of our team. Routine obstetric precautions reviewed. Return in about 5 weeks (around 11/29/2018) for ROB/2hrGTT.     Lajean Manes, [place] for [employer], [place]

## 2018-10-26 NOTE — Telephone Encounter (Signed)
Last office note, demographics and insurance information faxed to [facility] for a BP cuff.

## 2018-10-27 LAB — CULTURE, OB URINE

## 2018-10-27 LAB — OBSTETRIC PANEL, INCLUDING HIV
Antibody Screen: NEGATIVE
Basophils Absolute: 0 10*3/uL (ref 0.0–0.2)
Basos: 0 %
EOS (ABSOLUTE): 0.1 10*3/uL (ref 0.0–0.4)
Eos: 1 %
HIV Screen 4th Generation wRfx: NONREACTIVE
Hematocrit: 36.1 % (ref 34.0–46.6)
Hemoglobin: 12.2 g/dL (ref 11.1–15.9)
Hepatitis B Surface Ag: NEGATIVE
Immature Grans (Abs): 0.1 10*3/uL (ref 0.0–0.1)
Immature Granulocytes: 1 %
Lymphocytes Absolute: 1.9 10*3/uL (ref 0.7–3.1)
Lymphs: 16 %
MCH: 30.3 pg (ref 26.6–33.0)
MCHC: 33.8 g/dL (ref 31.5–35.7)
MCV: 90 fL (ref 79–97)
Monocytes Absolute: 0.7 10*3/uL (ref 0.1–0.9)
Monocytes: 6 %
Neutrophils Absolute: 9.1 10*3/uL — ABNORMAL HIGH (ref 1.4–7.0)
Neutrophils: 76 %
Platelets: 335 10*3/uL (ref 150–450)
RBC: 4.03 x10E6/uL (ref 3.77–5.28)
RDW: 12.4 % (ref 11.7–15.4)
RPR Ser Ql: NONREACTIVE
Rh Factor: NEGATIVE
Rubella Antibodies, IGG: 1.48 index (ref >0.99)
WBC: 11.9 10*3/uL — ABNORMAL HIGH (ref 3.4–10.8)

## 2018-10-27 LAB — CERVICOVAGINAL ANCILLARY ONLY
Bacterial vaginitis: NEGATIVE
Candida vaginitis: NEGATIVE
Chlamydia: NEGATIVE
Neisseria Gonorrhea: NEGATIVE
Trichomonas: NEGATIVE

## 2018-10-27 LAB — URINE CULTURE, OB REFLEX

## 2018-10-27 LAB — VARICELLA ZOSTER ANTIBODY, IGG: Varicella zoster IgG: <135 index — ABNORMAL LOW (ref >165)

## 2018-10-30 LAB — URINE DRUGS OF ABUSE SCREEN W ALC, ROUTINE (REF LAB)
Amphetamines, Urine: NEGATIVE ng/mL
Barbiturate Quant, Ur: NEGATIVE ng/mL
Benzodiazepine Quant, Ur: NEGATIVE ng/mL
Cocaine (Metab.): NEGATIVE ng/mL
Ethanol, Urine: NEGATIVE %
Methadone Screen, Urine: NEGATIVE ng/mL
Opiate Quant, Ur: NEGATIVE ng/mL
PCP Quant, Ur: NEGATIVE ng/mL
Propoxyphene: NEGATIVE ng/mL

## 2018-10-30 LAB — PANEL 799049
CARBOXY THC GC/MS CONF: 93 ng/mL
Cannabinoid GC/MS, Ur: POSITIVE — AB

## 2018-10-31 ENCOUNTER — Encounter: Payer: Self-pay | Admitting: Certified Nurse Midwife

## 2018-10-31 DIAGNOSIS — F1220 Cannabis dependence, uncomplicated: Secondary | ICD-10-CM | POA: Insufficient documentation

## 2018-11-01 ENCOUNTER — Telehealth: Payer: Self-pay

## 2018-11-01 NOTE — Telephone Encounter (Signed)
Spoke with pt and she is aware of low risk genetic results and that predicted fetal sex is female. Pt expressed understanding.

## 2018-11-04 ENCOUNTER — Ambulatory Visit (HOSPITAL_COMMUNITY)
Admission: RE | Admit: 2018-11-04 | Payer: Medicaid Other | Source: Ambulatory Visit | Attending: Certified Nurse Midwife | Admitting: Certified Nurse Midwife

## 2018-11-04 ENCOUNTER — Ambulatory Visit (HOSPITAL_COMMUNITY): Payer: Medicaid Other | Attending: Obstetrics and Gynecology

## 2018-11-30 ENCOUNTER — Encounter: Payer: Medicaid Other | Admitting: Certified Nurse Midwife

## 2018-11-30 ENCOUNTER — Other Ambulatory Visit: Payer: Medicaid Other

## 2018-12-09 ENCOUNTER — Encounter: Payer: Medicaid Other | Admitting: Family Medicine

## 2018-12-09 ENCOUNTER — Other Ambulatory Visit: Payer: Medicaid Other

## 2018-12-19 ENCOUNTER — Encounter: Payer: Medicaid Other | Admitting: Advanced Practice Midwife

## 2018-12-19 ENCOUNTER — Other Ambulatory Visit: Payer: Medicaid Other

## 2018-12-19 NOTE — Progress Notes (Deleted)
   PRENATAL VISIT NOTE  Subjective:  Morgan Lewis is a 23 y.o. [phone] at [redacted]w[redacted]d being seen today for ongoing prenatal care.  She is currently monitored for the following issues for this {Blank single:19197::"high-risk","low-risk"} pregnancy and has Depression, major, single episode, moderate ([place]); Oppositional defiant disorder; Attention-deficit hyperactivity disorder, combined type; Generalized anxiety disorder; Mental disorders of mother, antepartum; Rh negative state in antepartum period; Cholecystitis; ADHD (attention deficit hyperactivity disorder); Previous cesarean section; Supervision of other normal pregnancy, antepartum; Late prenatal care affecting pregnancy in second trimester; and Cannabis dependence with current use ([place]) on their problem list.  Patient reports {sx:14538}.   .  .   . Denies leaking of fluid.   The following portions of the patient's history were reviewed and updated as appropriate: allergies, current medications, past family history, past medical history, past social history, past surgical history and problem list.   Objective:  There were no vitals filed for this visit.  Fetal Status:           General:  Alert, oriented and cooperative. Patient is in no acute distress.  Skin: Skin is warm and dry. No rash noted.   Cardiovascular: Normal heart rate noted  Respiratory: Normal respiratory effort, no problems with respiration noted  Abdomen: Soft, gravid, appropriate for gestational age.        Pelvic: {Blank single:19197::"Cervical exam performed","Cervical exam deferred"}        Extremities: Normal range of motion.     Mental Status: Normal mood and affect. Normal behavior. Normal judgment and thought content.   Assessment and Plan:  Pregnancy: G4P3003 at [redacted]w[redacted]d There are no diagnoses linked to this encounter. {Blank single:19197::"Term","Preterm"} labor symptoms and general obstetric precautions including but not limited to vaginal bleeding,  contractions, leaking of fluid and fetal movement were reviewed in detail with the patient. Please refer to After Visit Summary for other counseling recommendations.   No follow-ups on file.  Future Appointments  Date Time Provider [place]  12/19/2018  9:15 AM [facility] None  12/19/2018  9:55 AM [place], Kathie Dike, CNM [facility] None  12/23/2018  1:30 PM [facility] NURSE [place] [facility]  12/23/2018  1:30 PM [place] 1 [facility]    Fatima Blank, CNM

## 2018-12-23 ENCOUNTER — Other Ambulatory Visit (HOSPITAL_COMMUNITY): Payer: Self-pay | Admitting: *Deleted

## 2018-12-23 ENCOUNTER — Encounter (HOSPITAL_COMMUNITY): Payer: Self-pay

## 2018-12-23 ENCOUNTER — Ambulatory Visit (HOSPITAL_COMMUNITY): Payer: Medicaid Other | Admitting: *Deleted

## 2018-12-23 ENCOUNTER — Other Ambulatory Visit: Payer: Self-pay

## 2018-12-23 ENCOUNTER — Ambulatory Visit (HOSPITAL_COMMUNITY)
Admission: RE | Admit: 2018-12-23 | Discharge: 2018-12-23 | Disposition: A | Discharge status: Home / Self Care | Payer: Medicaid Other | Source: Ambulatory Visit | Attending: Certified Nurse Midwife | Admitting: Certified Nurse Midwife

## 2018-12-23 DIAGNOSIS — O0933 Supervision of pregnancy with insufficient antenatal care, third trimester: Secondary | ICD-10-CM | POA: Diagnosis not present

## 2018-12-23 DIAGNOSIS — O99213 Obesity complicating pregnancy, third trimester: Secondary | ICD-10-CM | POA: Diagnosis not present

## 2018-12-23 DIAGNOSIS — Z362 Encounter for other antenatal screening follow-up: Secondary | ICD-10-CM

## 2018-12-23 DIAGNOSIS — Z3480 Encounter for supervision of other normal pregnancy, unspecified trimester: Secondary | ICD-10-CM

## 2018-12-23 DIAGNOSIS — O34219 Maternal care for unspecified type scar from previous cesarean delivery: Secondary | ICD-10-CM

## 2018-12-23 DIAGNOSIS — O0932 Supervision of pregnancy with insufficient antenatal care, second trimester: Secondary | ICD-10-CM | POA: Diagnosis present

## 2018-12-23 DIAGNOSIS — F1220 Cannabis dependence, uncomplicated: Secondary | ICD-10-CM

## 2018-12-23 DIAGNOSIS — Z3A30 30 weeks gestation of pregnancy: Secondary | ICD-10-CM

## 2019-01-03 ENCOUNTER — Telehealth: Payer: Self-pay | Admitting: Advanced Practice Midwife

## 2019-01-12 ENCOUNTER — Other Ambulatory Visit: Payer: Medicaid Other

## 2019-01-12 ENCOUNTER — Encounter: Payer: Medicaid Other | Admitting: Obstetrics

## 2019-01-20 ENCOUNTER — Ambulatory Visit (HOSPITAL_COMMUNITY)
Admission: RE | Admit: 2019-01-20 | Discharge: 2019-01-20 | Disposition: A | Discharge status: Home / Self Care | Payer: Medicaid Other | Source: Ambulatory Visit | Attending: Obstetrics and Gynecology | Admitting: Obstetrics and Gynecology

## 2019-01-20 ENCOUNTER — Other Ambulatory Visit: Payer: Self-pay

## 2019-01-20 ENCOUNTER — Ambulatory Visit (HOSPITAL_COMMUNITY): Payer: Medicaid Other | Admitting: *Deleted

## 2019-01-20 ENCOUNTER — Encounter (HOSPITAL_COMMUNITY): Payer: Self-pay | Admitting: *Deleted

## 2019-01-20 DIAGNOSIS — Z362 Encounter for other antenatal screening follow-up: Secondary | ICD-10-CM

## 2019-01-20 DIAGNOSIS — Z3A34 34 weeks gestation of pregnancy: Secondary | ICD-10-CM | POA: Diagnosis not present

## 2019-01-20 DIAGNOSIS — O99213 Obesity complicating pregnancy, third trimester: Secondary | ICD-10-CM

## 2019-01-20 DIAGNOSIS — F1220 Cannabis dependence, uncomplicated: Secondary | ICD-10-CM | POA: Diagnosis present

## 2019-01-20 DIAGNOSIS — O0933 Supervision of pregnancy with insufficient antenatal care, third trimester: Secondary | ICD-10-CM

## 2019-01-20 DIAGNOSIS — Z3480 Encounter for supervision of other normal pregnancy, unspecified trimester: Secondary | ICD-10-CM | POA: Insufficient documentation

## 2019-01-27 ENCOUNTER — Encounter (HOSPITAL_COMMUNITY): Payer: Self-pay

## 2019-01-27 ENCOUNTER — Telehealth: Payer: Self-pay | Admitting: Obstetrics

## 2019-01-27 ENCOUNTER — Other Ambulatory Visit: Payer: Self-pay

## 2019-01-27 ENCOUNTER — Inpatient Hospital Stay (HOSPITAL_COMMUNITY)
Admission: AD | Admit: 2019-01-27 | Discharge: 2019-01-27 | Disposition: A | Discharge status: Home / Self Care | Payer: Medicaid Other | Attending: Obstetrics & Gynecology | Admitting: Obstetrics & Gynecology

## 2019-01-27 DIAGNOSIS — Z87891 Personal history of nicotine dependence: Secondary | ICD-10-CM | POA: Insufficient documentation

## 2019-01-27 DIAGNOSIS — Z9049 Acquired absence of other specified parts of digestive tract: Secondary | ICD-10-CM | POA: Insufficient documentation

## 2019-01-27 DIAGNOSIS — O163 Unspecified maternal hypertension, third trimester: Secondary | ICD-10-CM | POA: Insufficient documentation

## 2019-01-27 DIAGNOSIS — Z98891 History of uterine scar from previous surgery: Secondary | ICD-10-CM | POA: Insufficient documentation

## 2019-01-27 DIAGNOSIS — O99213 Obesity complicating pregnancy, third trimester: Secondary | ICD-10-CM | POA: Diagnosis not present

## 2019-01-27 DIAGNOSIS — E669 Obesity, unspecified: Secondary | ICD-10-CM | POA: Insufficient documentation

## 2019-01-27 DIAGNOSIS — O99343 Other mental disorders complicating pregnancy, third trimester: Secondary | ICD-10-CM | POA: Insufficient documentation

## 2019-01-27 DIAGNOSIS — O0933 Supervision of pregnancy with insufficient antenatal care, third trimester: Secondary | ICD-10-CM | POA: Diagnosis not present

## 2019-01-27 DIAGNOSIS — F329 Major depressive disorder, single episode, unspecified: Secondary | ICD-10-CM | POA: Insufficient documentation

## 2019-01-27 DIAGNOSIS — Z3A35 35 weeks gestation of pregnancy: Secondary | ICD-10-CM | POA: Diagnosis not present

## 2019-01-27 DIAGNOSIS — O4703 False labor before 37 completed weeks of gestation, third trimester: Secondary | ICD-10-CM | POA: Diagnosis present

## 2019-01-27 DIAGNOSIS — O479 False labor, unspecified: Secondary | ICD-10-CM

## 2019-01-27 LAB — WET PREP, GENITAL
Clue Cells Wet Prep HPF POC: NONE SEEN
Sperm: NONE SEEN
Trich, Wet Prep: NONE SEEN
Yeast Wet Prep HPF POC: NONE SEEN

## 2019-01-27 LAB — URINALYSIS, ROUTINE W REFLEX MICROSCOPIC
Bilirubin Urine: NEGATIVE
Glucose, UA: NEGATIVE mg/dL
Hgb urine dipstick: NEGATIVE
Ketones, ur: NEGATIVE mg/dL
Nitrite: NEGATIVE
Protein, ur: NEGATIVE mg/dL
Specific Gravity, Urine: 1.015 (ref 1.005–1.030)
pH: 7 (ref 5.0–8.0)

## 2019-01-27 MED ORDER — NIFEDIPINE 10 MG PO CAPS
10.0000 mg | ORAL_CAPSULE | Freq: Once | ORAL | Status: AC
  Administered 2019-01-27: 10 mg via ORAL
  Filled 2019-01-27: qty 1

## 2019-01-27 NOTE — MAU Note (Signed)
Pt complaining of back pain that comes and go. Only feels lower pressure, but that is not new.  Is not sure if she's in labor. +FM, Denies LOF or VB.

## 2019-01-27 NOTE — MAU Provider Note (Signed)
History     CSN: [id number]  Arrival date and time: 01/27/19 1503   First Provider Initiated Contact with Patient 01/27/19 1622      No chief complaint on file.  [name] is a 23 y.o. female [phone] @ 9w6dwith very limited prenatal care and 3 previous C/s here with contractions.     Past Medical History:  Diagnosis Date  . ADHD (attention deficit hyperactivity disorder) 04/17/2013  . Anemia   . 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 Depression    has never taken meds  . Generalized anxiety disorder   . Headache   . Hypertension   . Obesity   . Supervision of high risk pregnancy, antepartum 12/03/2016    [facility]  by lmp 03/23/17  Blood type: O/Negative/-- (08/30 0920)  Genetic Screen Too late Antibody:Negative (08/30 0920) Anatomic [place] Rubella: 1.14 (08/30 0920) GTT Third trimester:  RPR: Non Reactive (08/30 0920)  Flu vaccine  HBsAg: Negative (08/30 0920)  TDaP vaccine                                              HIV:   Negative Baby Food   undecided                     Past Surgical History:  Procedure Laterality Date  . CESAREAN SECTION N/A 03/15/2013   Procedure: CESAREAN SECTION;  Surgeon: PMora Bellman MD;  [facility];  Service: Obstetrics;  Laterality: N/A;  . CHOLECYSTECTOMY N/A 04/16/2013   Procedure: LAPAROSCOPIC CHOLECYSTECTOMY WITH INTRAOPERATIVE CHOLANGIOGRAM;  Surgeon: TOdis Hollingshead MD;  [facility];  Service: General;  Laterality: N/A;    Family History  Adopted: Yes  Problem Relation Age of Onset  . Drug abuse Mother   . Drug abuse Father     Social History   Tobacco Use  . Smoking status: Former Smoker    Types: Cigars  . Smokeless tobacco: Never Used  . Tobacco comment: black and mild quit 2018  Substance Use Topics  . Alcohol use: Not Currently  . Drug use: Yes    Types: Marijuana    Comment: couple days ago    Allergies: No Known  Allergies  Medications Prior to Admission  Medication Sig Dispense Refill Last Dose  . Blood Pressure Monitoring KIT 1 kit by Does not apply route once a week. 1 kit 0   . metroNIDAZOLE (FLAGYL) 500 MG tablet Take 1 tablet (500 mg total) by mouth 2 (two) times daily. (Patient not taking: Reported on 01/20/2019) 14 tablet 0    Results for orders placed or performed during the hospital encounter of 01/27/19 (from the past 48 hour(s))  Urinalysis, Routine w reflex microscopic     Status: Abnormal   Collection Time: 01/27/19  3:50 PM  Result Value Ref Range   Color, Urine YELLOW YELLOW   APPearance CLOUDY (A) CLEAR   Specific Gravity, Urine 1.015 1.005 - 1.030   pH 7.0 5.0 - 8.0   Glucose, UA NEGATIVE NEGATIVE mg/dL   Hgb urine dipstick NEGATIVE NEGATIVE   Bilirubin Urine NEGATIVE NEGATIVE   Ketones, ur NEGATIVE NEGATIVE mg/dL   Protein, ur NEGATIVE NEGATIVE mg/dL   Nitrite NEGATIVE NEGATIVE   Leukocytes,Ua TRACE (A) NEGATIVE   RBC / HPF 0-5 0 - 5 RBC/hpf  WBC, UA 0-5 0 - 5 WBC/hpf   Bacteria, UA MANY (A) NONE SEEN   Squamous Epithelial / LPF 21-50 0 - 5   Mucus PRESENT     Comment: Performed at [facility], [place] [address], [place], [place] [address]  Wet prep, genital     Status: Abnormal   Collection Time: 01/27/19  5:57 PM   Specimen: PATH Cytology Cervicovaginal Ancillary Only  Result Value Ref Range   Yeast Wet Prep HPF POC NONE SEEN NONE SEEN   Trich, Wet Prep NONE SEEN NONE SEEN   Clue Cells Wet Prep HPF POC NONE SEEN NONE SEEN   WBC, Wet Prep HPF POC FEW (A) NONE SEEN   Sperm NONE SEEN     Comment: Performed at [facility], [place] [address] [place], [place] [address]   Review of Systems  Gastrointestinal: Positive for abdominal pain (+ contractions ).  Genitourinary: Negative for vaginal bleeding and vaginal discharge.   Physical Exam   Blood pressure 105/67, pulse (!) 115, temperature 97.6 F (36.4 C), temperature source Oral, resp. rate 18, height 5'  5" (1.651 m), weight 91.4 kg, last menstrual period 05/21/2018, SpO2 98 %, unknown if currently breastfeeding.  Physical Exam  Constitutional: She is oriented to person, place, and time. She appears well-developed and well-nourished. No distress.  HENT:  Head: Normocephalic.  Eyes: Pupils are equal, round, and reactive to light.  Genitourinary:    Genitourinary Comments: Cervix:  1 cm, thick, posterior    Musculoskeletal: Normal range of motion.  Neurological: She is alert and oriented to person, place, and time.  Skin: Skin is warm. She is not diaphoretic.  Psychiatric: Her behavior is normal.   Fetal Tracing: Baseline: 135 bpm Variability: Moderate  Accelerations: 15x15 Decelerations: None Toco: Occasional.   MAU Course  Procedures  None  MDM  Procardia given 1 dose oral Patient denies pain following procardia  Dr. Glo Herring down to see patient in MAU. Dr. Glo Herring reviewed fetal tracing.  GBS, Wet prep and GC collected.  Urine culture pending.   Assessment and Plan   A:  1. Braxton Hicks contractions   2. [redacted] weeks gestation of pregnancy   3. Previous cesarean section   4. Limited prenatal care in third trimester     P:  Discharge home in stable condition Return to MAU if symptoms worsen  Message sent to [facility] to reach out to patient to schedule appointment. She needs C-section scheduled. Discussed importance of this time.  Preterm labor precautions.   Noni Saupe I, NP 01/27/2019 7:30 PM

## 2019-01-29 DIAGNOSIS — O34219 Maternal care for unspecified type scar from previous cesarean delivery: Secondary | ICD-10-CM | POA: Diagnosis not present

## 2019-01-29 DIAGNOSIS — R102 Pelvic and perineal pain: Secondary | ICD-10-CM | POA: Diagnosis not present

## 2019-01-29 DIAGNOSIS — Z87891 Personal history of nicotine dependence: Secondary | ICD-10-CM | POA: Diagnosis not present

## 2019-01-29 DIAGNOSIS — O0933 Supervision of pregnancy with insufficient antenatal care, third trimester: Secondary | ICD-10-CM | POA: Diagnosis not present

## 2019-01-29 DIAGNOSIS — Z3A36 36 weeks gestation of pregnancy: Secondary | ICD-10-CM | POA: Diagnosis not present

## 2019-01-29 DIAGNOSIS — O26893 Other specified pregnancy related conditions, third trimester: Secondary | ICD-10-CM | POA: Diagnosis not present

## 2019-01-29 LAB — CULTURE, OB URINE
Culture: >=100000 — AB
Special Requests: NORMAL

## 2019-01-29 LAB — CULTURE, BETA STREP (GROUP B ONLY)

## 2019-01-31 ENCOUNTER — Encounter: Payer: Medicaid Other | Admitting: Advanced Practice Midwife

## 2019-01-31 LAB — GC/CHLAMYDIA PROBE AMP (~~LOC~~) NOT AT ARMC
Chlamydia: NEGATIVE
Comment: NEGATIVE
Comment: NORMAL
Neisseria Gonorrhea: NEGATIVE

## 2019-02-01 ENCOUNTER — Ambulatory Visit (INDEPENDENT_AMBULATORY_CARE_PROVIDER_SITE_OTHER): Payer: Medicaid Other | Admitting: Family Medicine

## 2019-02-01 ENCOUNTER — Other Ambulatory Visit: Payer: Self-pay

## 2019-02-01 VITALS — BP 132/84 | HR 85 | Wt 202.0 lb

## 2019-02-01 DIAGNOSIS — Z3A36 36 weeks gestation of pregnancy: Secondary | ICD-10-CM | POA: Diagnosis not present

## 2019-02-01 DIAGNOSIS — Z6791 Unspecified blood type, Rh negative: Secondary | ICD-10-CM

## 2019-02-01 DIAGNOSIS — O0933 Supervision of pregnancy with insufficient antenatal care, third trimester: Secondary | ICD-10-CM

## 2019-02-01 DIAGNOSIS — O26899 Other specified pregnancy related conditions, unspecified trimester: Secondary | ICD-10-CM

## 2019-02-01 DIAGNOSIS — Z98891 History of uterine scar from previous surgery: Secondary | ICD-10-CM

## 2019-02-01 DIAGNOSIS — Z3480 Encounter for supervision of other normal pregnancy, unspecified trimester: Secondary | ICD-10-CM

## 2019-02-01 DIAGNOSIS — O360130 Maternal care for anti-D [Rh] antibodies, third trimester, not applicable or unspecified: Secondary | ICD-10-CM | POA: Diagnosis not present

## 2019-02-01 DIAGNOSIS — O0932 Supervision of pregnancy with insufficient antenatal care, second trimester: Secondary | ICD-10-CM

## 2019-02-01 LAB — GLUCOSE, POCT (MANUAL RESULT ENTRY)
POC Glucose: 67 mg/dl — AB (ref 70–99)
POC Glucose: 67 mg/dl — AB (ref 70–99)

## 2019-02-01 MED ORDER — RHO D IMMUNE GLOBULIN 1500 UNIT/2ML IJ SOSY
300.0000 ug | PREFILLED_SYRINGE | Freq: Once | INTRAMUSCULAR | Status: AC
  Administered 2019-02-01: 300 ug via INTRAMUSCULAR

## 2019-02-01 NOTE — Patient Instructions (Signed)

## 2019-02-01 NOTE — Progress Notes (Signed)
   PRENATAL VISIT NOTE  Subjective:  Morgan Lewis is a 23 y.o. [phone] at [redacted]w[redacted]d being seen today for ongoing prenatal care.  She is currently monitored for the following issues for this high-risk pregnancy and has Depression, major, single episode, moderate ([place]); Oppositional defiant disorder; Attention-deficit hyperactivity disorder, combined type; Generalized anxiety disorder; Mental disorders of mother, antepartum; Rh negative state in antepartum period; Cholecystitis; ADHD (attention deficit hyperactivity disorder); Previous cesarean section; Supervision of other normal pregnancy, antepartum; Late prenatal care affecting pregnancy in second trimester; and Cannabis dependence with current use ([employer]) on their problem list.  Patient reports no complaints.  Contractions: Not present. Vag. Bleeding: None.  Movement: Present. Denies leaking of fluid.   The following portions of the patient's history were reviewed and updated as appropriate: allergies, current medications, past family history, past medical history, past social history, past surgical history and problem list.   Objective:   Vitals:   02/01/19 1446  BP: 132/84  Pulse: 85  Weight: 202 lb (91.6 kg)    Fetal Status: Fetal Heart Rate (bpm): 130 Fundal Height: 35 cm Movement: Present     General:  Alert, oriented and cooperative. Patient is in no acute distress.  Skin: Skin is warm and dry. No rash noted.   Cardiovascular: Normal heart rate noted  Respiratory: Normal respiratory effort, no problems with respiration noted  Abdomen: Soft, gravid, appropriate for gestational age.  Pain/Pressure: Present     Pelvic: Cervical exam deferred        Extremities: Normal range of motion.     Mental Status: Normal mood and affect. Normal behavior. Normal judgment and thought content.   Assessment and Plan:  Pregnancy: G4P3003 at [redacted]w[redacted]d 1. Late prenatal care affecting pregnancy in second trimester   2. Previous cesarean section X  3, booked for RCS and BTL  3. Supervision of other normal pregnancy, antepartum Has not had glucola and unlikely to return for this, random CBG 1 hour after soda was 67 - POCT Glucose (CBG)  4. Rh negative state in antepartum period Has not had Rhogam either - rho (d) immune globulin (RHIG/RHOPHYLAC) injection 300 mcg  Preterm labor symptoms and general obstetric precautions including but not limited to vaginal bleeding, contractions, leaking of fluid and fetal movement were reviewed in detail with the patient. Please refer to After Visit Summary for other counseling recommendations.   Return in about 1 year (around 02/01/2020) for in person.  Future Appointments  Date Time Provider [place]  02/08/2019  3:45 PM Nugent, Gerrie Nordmann, NP [facility] None  02/16/2019  8:20 AM [facility] 1 [facility] None    Donnamae Jude, MD

## 2019-02-03 ENCOUNTER — Encounter (HOSPITAL_COMMUNITY): Payer: Self-pay

## 2019-02-03 NOTE — Patient Instructions (Signed)
Morgan Lewis  02/03/2019   Your procedure is scheduled on:  02/18/2019  Arrive at [place] at [employer] [facility] on [address] at [facility]  and [employer] [address]. You are invited to use the FREE valet parking or use the Visitor's parking deck.  Pick up the phone at the desk and dial [phone].  Call this number if you have problems the morning of surgery: [phone]  Remember:   Do not eat food:(After Midnight) Desps de medianoche.  Do not drink clear liquids: (After Midnight) Desps de medianoche.  Take these medicines the morning of surgery with A SIP OF WATER:  none   Do not wear jewelry, make-up or nail polish.  Do not wear lotions, powders, or perfumes. Do not wear deodorant.  Do not shave 48 hours prior to surgery.  Do not bring valuables to the hospital.  [facility] is not   responsible for any belongings or valuables brought to the hospital.  Contacts, dentures or bridgework may not be worn into surgery.  Leave suitcase in the car. After surgery it may be brought to your room.  For patients admitted to the hospital, checkout time is 11:00 AM the day of              discharge.      Please read over the following fact sheets that you were given:     Preparing for Surgery

## 2019-02-07 ENCOUNTER — Other Ambulatory Visit: Payer: Self-pay

## 2019-02-07 ENCOUNTER — Inpatient Hospital Stay (HOSPITAL_COMMUNITY)
Admission: AD | Admit: 2019-02-07 | Discharge: 2019-02-07 | Disposition: A | Discharge status: Home / Self Care | Payer: Medicaid Other | Attending: Obstetrics & Gynecology | Admitting: Obstetrics & Gynecology

## 2019-02-07 ENCOUNTER — Encounter (HOSPITAL_COMMUNITY): Payer: Self-pay

## 2019-02-07 DIAGNOSIS — Z3A37 37 weeks gestation of pregnancy: Secondary | ICD-10-CM | POA: Diagnosis not present

## 2019-02-07 DIAGNOSIS — Z3480 Encounter for supervision of other normal pregnancy, unspecified trimester: Secondary | ICD-10-CM

## 2019-02-07 DIAGNOSIS — O471 False labor at or after 37 completed weeks of gestation: Secondary | ICD-10-CM

## 2019-02-07 DIAGNOSIS — F1220 Cannabis dependence, uncomplicated: Secondary | ICD-10-CM

## 2019-02-07 NOTE — MAU Note (Addendum)
Been contracting in her back last 3 hrs. No bleeding or leaking.  Has never gotten this far with any of her preg.  C/s is supposed to be on the 14th.  Loose stools all day today.

## 2019-02-07 NOTE — Discharge Instructions (Signed)
Braxton Hicks Contractions Contractions of the uterus can occur throughout pregnancy, but they are not always a sign that you are in labor. You may have practice contractions called Braxton Hicks contractions. These false labor contractions are sometimes confused with true labor. What are Braxton Hicks contractions? Braxton Hicks contractions are tightening movements that occur in the muscles of the uterus before labor. Unlike true labor contractions, these contractions do not result in opening (dilation) and thinning of the cervix. Toward the end of pregnancy (32-34 weeks), Braxton Hicks contractions can happen more often and may become stronger. These contractions are sometimes difficult to tell apart from true labor because they can be very uncomfortable. You should not feel embarrassed if you go to the hospital with false labor. Sometimes, the only way to tell if you are in true labor is for your health care provider to look for changes in the cervix. The health care provider will do a physical exam and may monitor your contractions. If you are not in true labor, the exam should show that your cervix is not dilating and your water has not broken. If there are no other health problems associated with your pregnancy, it is completely safe for you to be sent home with false labor. You may continue to have Braxton Hicks contractions until you go into true labor. How to tell the difference between true labor and false labor True labor  Contractions last 30-70 seconds.  Contractions become very regular.  Discomfort is usually felt in the top of the uterus, and it spreads to the lower abdomen and low back.  Contractions do not go away with walking.  Contractions usually become more intense and increase in frequency.  The cervix dilates and gets thinner. False labor  Contractions are usually shorter and not as strong as true labor contractions.  Contractions are usually irregular.  Contractions  are often felt in the front of the lower abdomen and in the groin.  Contractions may go away when you walk around or change positions while lying down.  Contractions get weaker and are shorter-lasting as time goes on.  The cervix usually does not dilate or become thin. Follow these instructions at home:   Take over-the-counter and prescription medicines only as told by your health care provider.  Keep up with your usual exercises and follow other instructions from your health care provider.  Eat and drink lightly if you think you are going into labor.  If Braxton Hicks contractions are making you uncomfortable: ? Change your position from lying down or resting to walking, or change from walking to resting. ? Sit and rest in a tub of warm water. ? Drink enough fluid to keep your urine pale yellow. Dehydration may cause these contractions. ? Do slow and deep breathing several times an hour.  Keep all follow-up prenatal visits as told by your health care provider. This is important. Contact a health care provider if:  You have a fever.  You have continuous pain in your abdomen. Get help right away if:  Your contractions become stronger, more regular, and closer together.  You have fluid leaking or gushing from your vagina.  You pass blood-tinged mucus (bloody show).  You have bleeding from your vagina.  You have low back pain that you never had before.  You feel your baby's head pushing down and causing pelvic pressure.  Your baby is not moving inside you as much as it used to. Summary  Contractions that occur before labor are   called Braxton Hicks contractions, false labor, or practice contractions.  Braxton Hicks contractions are usually shorter, weaker, farther apart, and less regular than true labor contractions. True labor contractions usually become progressively stronger and regular, and they become more frequent.  Manage discomfort from Braxton Hicks contractions  by changing position, resting in a warm bath, drinking plenty of water, or practicing deep breathing. This information is not intended to replace advice given to you by your health care provider. Make sure you discuss any questions you have with your health care provider. Document Released: 08/06/2016 Document Revised: 03/05/2017 Document Reviewed: 08/06/2016 Elsevier Patient Education  2020 Elsevier Inc.  

## 2019-02-07 NOTE — MAU Provider Note (Signed)
Morgan Lewis is a 23 y.o. [phone] female at [redacted]w[redacted]d  RN Labor check, not seen by provider SVE by RN: Dilation: 1 Effacement (%): Thick Exam by:: Elray Mcgregor, RN NST: FHR baseline 135-140s bpm, Variability: moderate, Accelerations:present, Decelerations:  Absent= Cat 1/Reactive Toco: irregular, every 4-10 minutes  D/C home  Morgan Lewis [facility] 02/07/2019 6:26 PM

## 2019-02-08 ENCOUNTER — Encounter: Payer: Medicaid Other | Admitting: Women's Health

## 2019-02-16 ENCOUNTER — Other Ambulatory Visit (HOSPITAL_COMMUNITY)
Admission: RE | Admit: 2019-02-16 | Discharge: 2019-02-16 | Disposition: A | Discharge status: Home / Self Care | Payer: Medicaid Other | Source: Ambulatory Visit | Attending: Obstetrics and Gynecology | Admitting: Obstetrics and Gynecology

## 2019-02-17 ENCOUNTER — Other Ambulatory Visit (HOSPITAL_COMMUNITY)
Admission: RE | Admit: 2019-02-17 | Discharge: 2019-02-17 | Disposition: A | Discharge status: Home / Self Care | Payer: Medicaid Other | Source: Ambulatory Visit | Attending: Obstetrics and Gynecology | Admitting: Obstetrics and Gynecology

## 2019-02-17 ENCOUNTER — Other Ambulatory Visit: Payer: Self-pay

## 2019-02-17 DIAGNOSIS — Z01812 Encounter for preprocedural laboratory examination: Secondary | ICD-10-CM | POA: Insufficient documentation

## 2019-02-17 DIAGNOSIS — Z20828 Contact with and (suspected) exposure to other viral communicable diseases: Secondary | ICD-10-CM | POA: Insufficient documentation

## 2019-02-17 LAB — COMPREHENSIVE METABOLIC PANEL
ALT: 19 U/L (ref 0–44)
AST: 23 U/L (ref 15–41)
Albumin: 2.9 g/dL — ABNORMAL LOW (ref 3.5–5.0)
Alkaline Phosphatase: 144 U/L — ABNORMAL HIGH (ref 38–126)
Anion gap: 12 (ref 5–15)
BUN: 9 mg/dL (ref 6–20)
CO2: 21 mmol/L — ABNORMAL LOW (ref 22–32)
Calcium: 9.6 mg/dL (ref 8.9–10.3)
Chloride: 102 mmol/L (ref 98–111)
Creatinine, Ser: 0.48 mg/dL (ref 0.44–1.00)
GFR calc Af Amer: >60 mL/min (ref >60)
GFR calc non Af Amer: >60 mL/min (ref >60)
Glucose, Bld: 81 mg/dL (ref 70–99)
Potassium: 4.2 mmol/L (ref 3.5–5.1)
Sodium: 135 mmol/L (ref 135–145)
Total Bilirubin: 0.5 mg/dL (ref 0.3–1.2)
Total Protein: 6.6 g/dL (ref 6.5–8.1)

## 2019-02-17 LAB — CBC
HCT: 34.4 % — ABNORMAL LOW (ref 36.0–46.0)
Hemoglobin: 11.9 g/dL — ABNORMAL LOW (ref 12.0–15.0)
MCH: 29.7 pg (ref 26.0–34.0)
MCHC: 34.6 g/dL (ref 30.0–36.0)
MCV: 85.8 fL (ref 80.0–100.0)
Platelets: 254 10*3/uL (ref 150–400)
RBC: 4.01 MIL/uL (ref 3.87–5.11)
RDW: 13.5 % (ref 11.5–15.5)
WBC: 7 10*3/uL (ref 4.0–10.5)
nRBC: 0 % (ref 0.0–0.2)

## 2019-02-17 LAB — SARS CORONAVIRUS 2 (TAT 6-24 HRS): SARS Coronavirus 2: NEGATIVE

## 2019-02-17 NOTE — H&P (Signed)
Retha Bither is a 23 y.o. female [phone] at 39w 0 d prior cesarean x 3,  presenting for Admission for repeat cesarean section and bilateral tubal ligation.Prenatal course is followed at [facility].She is currently monitored for the following issues for this high-risk pregnancy and has Depression, major, single episode, moderate (HCC); Oppositional defiant disorder; Attention-deficit hyperactivity disorder, combined type; Generalized anxiety disorder; Mental disorders of mother, antepartum; Rh negative state in antepartum period; Cholecystitis; ADHD (attention deficit hyperactivity disorder); Previous cesarean section; Supervision of other normal pregnancy, antepartum; Late prenatal care affecting pregnancy in second trimester; and Cannabis dependence with current use (HCC) on their problem list. She had limited prenatal visits, no documented visits July til October. Intermittent MAU visits for false labor.   OB History    Gravida  4   Para  3   Term  3   Preterm  0   AB  0   Living  3     SAB  0   TAB  0   Ectopic  0   Multiple  0   Live Births  3          Past Medical History:  Diagnosis Date  . ADHD (attention deficit hyperactivity disorder) 04/17/2013  . Anemia   . 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 Depression    has never taken meds  . Generalized anxiety disorder   . Headache   . Hypertension   . Obesity   . Supervision of high risk pregnancy, antepartum 12/03/2016    Clinic  [facility] Prenatal Labs Dating  by lmp 03/23/17  Blood type: O/Negative/-- (08/30 0920)  Genetic Screen Too late Antibody:Negative (08/30 0920) Anatomic [place]  Rubella: 1.14 (08/30 0920) GTT Third trimester:  RPR: Non Reactive (08/30 0920)  Flu vaccine  HBsAg: Negative (08/30 0920)  TDaP vaccine                                              HIV:   Negative Baby Food   undecided                    Past Surgical History:  Procedure Laterality Date  . CESAREAN SECTION  N/A 03/15/2013   Procedure: CESAREAN SECTION;  Surgeon: Catalina Antigua, MD;  Location: [facility];  Service: Obstetrics;  Laterality: N/A;  . CHOLECYSTECTOMY N/A 04/16/2013   Procedure: LAPAROSCOPIC CHOLECYSTECTOMY WITH INTRAOPERATIVE CHOLANGIOGRAM;  Surgeon: Adolph Pollack, MD;  Location: [facility];  Service: General;  Laterality: N/A;   Family History: family history includes Drug abuse in her father and mother. She was adopted. Social History:  reports that she has quit smoking. Her smoking use included cigars. She has never used smokeless tobacco. She reports previous alcohol use. She reports previous drug use. Drug: Marijuana.     Maternal Diabetes: No did not do GTT that I find in record. Genetic Screening: Declined Maternal Ultrasounds/Referrals: Normal third trimester thru MFM Fetal Ultrasounds or other Referrals:  None Maternal Substance Abuse:  Yes:  Type: Marijuana Significant Maternal Medications:  None Significant Maternal Lab Results:  Group B Strep negative Other Comments:  history of oppositional defiant maneuver  ROS History desires tubal ligation signed on ______   Last menstrual period 05/21/2018, unknown if currently breastfeeding. Exam Physical Exam  Prenatal labs: ABO, Rh: --/--/O NEG (11/13 1018) Antibody: POS (11/13 1018) Rubella: 1.48 (  07/21 1110) RPR: Non Reactive (07/21 1110)  HBsAg: Negative (07/21 1110)  HIV: Non Reactive (07/21 1110)  GBS:   negative  Assessment/Plan: Pregnancy 39.0 wk Prior cesarean x 3 for repeat c/s Desire for permantent sterilization    Jonnie Kind 02/17/2019, 10:21 PM

## 2019-02-17 NOTE — MAU Note (Signed)
Asymptomatic, swab collected. Lab called for blood work. 

## 2019-02-18 ENCOUNTER — Inpatient Hospital Stay (HOSPITAL_COMMUNITY): Payer: Medicaid Other | Admitting: Anesthesiology

## 2019-02-18 ENCOUNTER — Encounter (HOSPITAL_COMMUNITY): Payer: Self-pay | Admitting: General Practice

## 2019-02-18 ENCOUNTER — Encounter (HOSPITAL_COMMUNITY)
Admission: AD | Disposition: A | Discharge status: Home / Self Care | Payer: Self-pay | Source: Home / Self Care | Attending: Obstetrics and Gynecology

## 2019-02-18 ENCOUNTER — Inpatient Hospital Stay (HOSPITAL_COMMUNITY)
Admission: AD | Admit: 2019-02-18 | Discharge: 2019-02-21 | DRG: 784 | Disposition: A | Discharge status: Home / Self Care | Payer: Medicaid Other | Attending: Obstetrics and Gynecology | Admitting: Obstetrics and Gynecology

## 2019-02-18 ENCOUNTER — Inpatient Hospital Stay (HOSPITAL_COMMUNITY)
Admission: RE | Admit: 2019-02-18 | Payer: Medicaid Other | Source: Home / Self Care | Admitting: Obstetrics and Gynecology

## 2019-02-18 DIAGNOSIS — O26893 Other specified pregnancy related conditions, third trimester: Secondary | ICD-10-CM | POA: Diagnosis present

## 2019-02-18 DIAGNOSIS — Z3480 Encounter for supervision of other normal pregnancy, unspecified trimester: Secondary | ICD-10-CM

## 2019-02-18 DIAGNOSIS — F902 Attention-deficit hyperactivity disorder, combined type: Secondary | ICD-10-CM | POA: Diagnosis present

## 2019-02-18 DIAGNOSIS — Z302 Encounter for sterilization: Secondary | ICD-10-CM | POA: Diagnosis not present

## 2019-02-18 DIAGNOSIS — F913 Oppositional defiant disorder: Secondary | ICD-10-CM | POA: Diagnosis present

## 2019-02-18 DIAGNOSIS — O0932 Supervision of pregnancy with insufficient antenatal care, second trimester: Secondary | ICD-10-CM

## 2019-02-18 DIAGNOSIS — O99344 Other mental disorders complicating childbirth: Secondary | ICD-10-CM | POA: Diagnosis present

## 2019-02-18 DIAGNOSIS — F411 Generalized anxiety disorder: Secondary | ICD-10-CM | POA: Diagnosis present

## 2019-02-18 DIAGNOSIS — O34211 Maternal care for low transverse scar from previous cesarean delivery: Secondary | ICD-10-CM | POA: Diagnosis not present

## 2019-02-18 DIAGNOSIS — Z87891 Personal history of nicotine dependence: Secondary | ICD-10-CM | POA: Diagnosis not present

## 2019-02-18 DIAGNOSIS — O99324 Drug use complicating childbirth: Secondary | ICD-10-CM | POA: Diagnosis present

## 2019-02-18 DIAGNOSIS — Z20828 Contact with and (suspected) exposure to other viral communicable diseases: Secondary | ICD-10-CM | POA: Diagnosis present

## 2019-02-18 DIAGNOSIS — O360130 Maternal care for anti-D [Rh] antibodies, third trimester, not applicable or unspecified: Secondary | ICD-10-CM | POA: Diagnosis not present

## 2019-02-18 DIAGNOSIS — Z6791 Unspecified blood type, Rh negative: Secondary | ICD-10-CM

## 2019-02-18 DIAGNOSIS — O43813 Placental infarction, third trimester: Secondary | ICD-10-CM | POA: Diagnosis not present

## 2019-02-18 DIAGNOSIS — F321 Major depressive disorder, single episode, moderate: Secondary | ICD-10-CM | POA: Diagnosis present

## 2019-02-18 DIAGNOSIS — Z3A39 39 weeks gestation of pregnancy: Secondary | ICD-10-CM

## 2019-02-18 DIAGNOSIS — O26899 Other specified pregnancy related conditions, unspecified trimester: Secondary | ICD-10-CM

## 2019-02-18 DIAGNOSIS — F1220 Cannabis dependence, uncomplicated: Secondary | ICD-10-CM | POA: Diagnosis present

## 2019-02-18 DIAGNOSIS — Z98891 History of uterine scar from previous surgery: Secondary | ICD-10-CM

## 2019-02-18 LAB — RPR: RPR Ser Ql: NONREACTIVE

## 2019-02-18 SURGERY — Surgical Case
Anesthesia: Spinal

## 2019-02-18 MED ORDER — SODIUM CHLORIDE 0.9% FLUSH: 3.0000 mL | INTRAVENOUS | Status: DC | PRN

## 2019-02-18 MED ORDER — PRENATAL MULTIVITAMIN CH
1.0000 | ORAL_TABLET | Freq: Every day | ORAL | Status: DC
  Administered 2019-02-19 – 2019-02-20 (×2): 1 via ORAL
  Filled 2019-02-18 (×2): qty 1

## 2019-02-18 MED ORDER — ACETAMINOPHEN 325 MG PO TABS
650.0000 mg | ORAL_TABLET | Freq: Four times a day (QID) | ORAL | Status: DC | PRN
  Administered 2019-02-18 – 2019-02-21 (×6): 650 mg via ORAL
  Filled 2019-02-18 (×6): qty 2

## 2019-02-18 MED ORDER — MORPHINE SULFATE (PF) 0.5 MG/ML IJ SOLN
INTRAMUSCULAR | Status: AC
  Filled 2019-02-18: qty 10

## 2019-02-18 MED ORDER — FENTANYL CITRATE (PF) 100 MCG/2ML IJ SOLN
INTRAMUSCULAR | Status: AC
  Filled 2019-02-18: qty 2

## 2019-02-18 MED ORDER — SOD CITRATE-CITRIC ACID 500-334 MG/5ML PO SOLN
ORAL | Status: AC
  Filled 2019-02-18: qty 15

## 2019-02-18 MED ORDER — SIMETHICONE 80 MG PO CHEW
80.0000 mg | CHEWABLE_TABLET | ORAL | Status: DC
  Administered 2019-02-19 – 2019-02-21 (×3): 80 mg via ORAL
  Filled 2019-02-18 (×3): qty 1

## 2019-02-18 MED ORDER — KETOROLAC TROMETHAMINE 30 MG/ML IJ SOLN
30.0000 mg | Freq: Once | INTRAMUSCULAR | Status: AC | PRN
  Administered 2019-02-18: 11:00:00 30 mg via INTRAVENOUS

## 2019-02-18 MED ORDER — SIMETHICONE 80 MG PO CHEW: 80.0000 mg | CHEWABLE_TABLET | ORAL | Status: DC | PRN

## 2019-02-18 MED ORDER — ENOXAPARIN SODIUM 40 MG/0.4ML ~~LOC~~ SOLN
40.0000 mg | SUBCUTANEOUS | Status: DC
  Administered 2019-02-19 – 2019-02-21 (×3): 40 mg via SUBCUTANEOUS
  Filled 2019-02-18 (×3): qty 0.4

## 2019-02-18 MED ORDER — HYDROMORPHONE HCL 1 MG/ML IJ SOLN: 0.2500 mg | INTRAMUSCULAR | Status: DC | PRN

## 2019-02-18 MED ORDER — MEPERIDINE HCL 25 MG/ML IJ SOLN
INTRAMUSCULAR | Status: DC | PRN
  Administered 2019-02-18 (×2): 12.5 mg via INTRAVENOUS

## 2019-02-18 MED ORDER — DEXAMETHASONE SODIUM PHOSPHATE 4 MG/ML IJ SOLN
INTRAMUSCULAR | Status: DC | PRN
  Administered 2019-02-18: 10 mg via INTRAVENOUS

## 2019-02-18 MED ORDER — METOCLOPRAMIDE HCL 5 MG/ML IJ SOLN
INTRAMUSCULAR | Status: DC | PRN
  Administered 2019-02-18 (×2): 5 mg via INTRAVENOUS

## 2019-02-18 MED ORDER — NALBUPHINE HCL 10 MG/ML IJ SOLN
5.0000 mg | Freq: Once | INTRAMUSCULAR | Status: DC | PRN
  Filled 2019-02-18: qty 0.5

## 2019-02-18 MED ORDER — CEFAZOLIN SODIUM-DEXTROSE 2-4 GM/100ML-% IV SOLN
INTRAVENOUS | Status: AC
  Filled 2019-02-18: qty 100

## 2019-02-18 MED ORDER — METOCLOPRAMIDE HCL 5 MG/ML IJ SOLN
INTRAMUSCULAR | Status: AC
  Filled 2019-02-18: qty 2

## 2019-02-18 MED ORDER — OXYTOCIN 40 UNITS IN NORMAL SALINE INFUSION - SIMPLE MED: 2.5000 [IU]/h | INTRAVENOUS | Status: AC

## 2019-02-18 MED ORDER — PHENYLEPHRINE HCL-NACL 20-0.9 MG/250ML-% IV SOLN
INTRAVENOUS | Status: AC
  Filled 2019-02-18: qty 250

## 2019-02-18 MED ORDER — LACTATED RINGERS IV SOLN
INTRAVENOUS | Status: DC
  Administered 2019-02-18 – 2019-02-19 (×2): via INTRAVENOUS

## 2019-02-18 MED ORDER — MEPERIDINE HCL 25 MG/ML IJ SOLN
INTRAMUSCULAR | Status: AC
  Filled 2019-02-18: qty 1

## 2019-02-18 MED ORDER — DEXAMETHASONE SODIUM PHOSPHATE 4 MG/ML IJ SOLN
INTRAMUSCULAR | Status: AC
  Filled 2019-02-18: qty 7

## 2019-02-18 MED ORDER — PROMETHAZINE HCL 25 MG/ML IJ SOLN: 6.2500 mg | INTRAMUSCULAR | Status: DC | PRN

## 2019-02-18 MED ORDER — NALOXONE HCL 4 MG/10ML IJ SOLN
1.0000 ug/kg/h | INTRAVENOUS | Status: DC | PRN
  Filled 2019-02-18: qty 5

## 2019-02-18 MED ORDER — PHENYLEPHRINE HCL-NACL 20-0.9 MG/250ML-% IV SOLN
INTRAVENOUS | Status: DC | PRN
  Administered 2019-02-18: 60 ug/min via INTRAVENOUS

## 2019-02-18 MED ORDER — LACTATED RINGERS IV SOLN
INTRAVENOUS | Status: DC | PRN
  Administered 2019-02-18: 08:00:00 via INTRAVENOUS

## 2019-02-18 MED ORDER — SODIUM CHLORIDE 0.9 % IR SOLN
Status: DC | PRN
  Administered 2019-02-18: 1

## 2019-02-18 MED ORDER — COCONUT OIL OIL: 1.0000 "application " | TOPICAL_OIL | Status: DC | PRN

## 2019-02-18 MED ORDER — FENTANYL CITRATE (PF) 100 MCG/2ML IJ SOLN
INTRAMUSCULAR | Status: DC | PRN
  Administered 2019-02-18: 15 ug via INTRATHECAL

## 2019-02-18 MED ORDER — MEPERIDINE HCL 25 MG/ML IJ SOLN: 6.2500 mg | INTRAMUSCULAR | Status: DC | PRN

## 2019-02-18 MED ORDER — OXYCODONE HCL 5 MG/5ML PO SOLN: 5.0000 mg | Freq: Once | ORAL | Status: DC | PRN

## 2019-02-18 MED ORDER — NALOXONE HCL 0.4 MG/ML IJ SOLN: 0.4000 mg | INTRAMUSCULAR | Status: DC | PRN

## 2019-02-18 MED ORDER — SCOPOLAMINE 1 MG/3DAYS TD PT72
1.0000 | MEDICATED_PATCH | Freq: Once | TRANSDERMAL | Status: AC
  Administered 2019-02-18: 1.5 mg via TRANSDERMAL

## 2019-02-18 MED ORDER — SIMETHICONE 80 MG PO CHEW
80.0000 mg | CHEWABLE_TABLET | Freq: Three times a day (TID) | ORAL | Status: DC
  Administered 2019-02-18 – 2019-02-21 (×7): 80 mg via ORAL
  Filled 2019-02-18 (×7): qty 1

## 2019-02-18 MED ORDER — MIDAZOLAM HCL 2 MG/2ML IJ SOLN
INTRAMUSCULAR | Status: DC | PRN
  Administered 2019-02-18 (×4): 1 mg via INTRAVENOUS

## 2019-02-18 MED ORDER — ZOLPIDEM TARTRATE 5 MG PO TABS: 5.0000 mg | ORAL_TABLET | Freq: Every evening | ORAL | Status: DC | PRN

## 2019-02-18 MED ORDER — OXYCODONE HCL 5 MG PO TABS
5.0000 mg | ORAL_TABLET | ORAL | Status: DC | PRN
  Administered 2019-02-18: 5 mg via ORAL
  Administered 2019-02-19: 10 mg via ORAL
  Administered 2019-02-19 (×4): 5 mg via ORAL
  Administered 2019-02-20 (×4): 10 mg via ORAL
  Administered 2019-02-20: 5 mg via ORAL
  Administered 2019-02-21: 10 mg via ORAL
  Filled 2019-02-18: qty 2
  Filled 2019-02-18: qty 1
  Filled 2019-02-18 (×4): qty 2
  Filled 2019-02-18: qty 1
  Filled 2019-02-18: qty 2
  Filled 2019-02-18: qty 1
  Filled 2019-02-18 (×3): qty 2

## 2019-02-18 MED ORDER — DIPHENHYDRAMINE HCL 50 MG/ML IJ SOLN
INTRAMUSCULAR | Status: DC | PRN
  Administered 2019-02-18: 25 mg via INTRAVENOUS

## 2019-02-18 MED ORDER — DIPHENHYDRAMINE HCL 25 MG PO CAPS: 25.0000 mg | ORAL_CAPSULE | Freq: Four times a day (QID) | ORAL | Status: DC | PRN

## 2019-02-18 MED ORDER — BUPIVACAINE IN DEXTROSE 0.75-8.25 % IT SOLN
INTRATHECAL | Status: DC | PRN
  Administered 2019-02-18: 1.6 mL via INTRATHECAL

## 2019-02-18 MED ORDER — SODIUM CHLORIDE 0.9 % IV SOLN
INTRAVENOUS | Status: DC | PRN
  Administered 2019-02-18: 09:00:00 via INTRAVENOUS

## 2019-02-18 MED ORDER — IBUPROFEN 800 MG PO TABS
800.0000 mg | ORAL_TABLET | Freq: Three times a day (TID) | ORAL | Status: DC
  Administered 2019-02-18 – 2019-02-21 (×8): 800 mg via ORAL
  Filled 2019-02-18 (×8): qty 1

## 2019-02-18 MED ORDER — NALBUPHINE HCL 10 MG/ML IJ SOLN
5.0000 mg | INTRAMUSCULAR | Status: DC | PRN
  Filled 2019-02-18: qty 0.5

## 2019-02-18 MED ORDER — DIBUCAINE (PERIANAL) 1 % EX OINT: 1.0000 "application " | TOPICAL_OINTMENT | CUTANEOUS | Status: DC | PRN

## 2019-02-18 MED ORDER — SCOPOLAMINE 1 MG/3DAYS TD PT72
MEDICATED_PATCH | TRANSDERMAL | Status: AC
  Filled 2019-02-18: qty 1

## 2019-02-18 MED ORDER — SOD CITRATE-CITRIC ACID 500-334 MG/5ML PO SOLN
30.0000 mL | ORAL | Status: AC
  Administered 2019-02-18: 07:00:00 30 mL via ORAL

## 2019-02-18 MED ORDER — LACTATED RINGERS IV BOLUS
1000.0000 mL | Freq: Once | INTRAVENOUS | Status: AC
  Administered 2019-02-18: 1000 mL via INTRAVENOUS

## 2019-02-18 MED ORDER — CEFAZOLIN SODIUM-DEXTROSE 2-4 GM/100ML-% IV SOLN: 2.0000 g | INTRAVENOUS | Status: DC

## 2019-02-18 MED ORDER — TETANUS-DIPHTH-ACELL PERTUSSIS 5-2.5-18.5 LF-MCG/0.5 IM SUSP
0.5000 mL | Freq: Once | INTRAMUSCULAR | Status: DC
  Filled 2019-02-18: qty 0.5

## 2019-02-18 MED ORDER — CEFAZOLIN SODIUM-DEXTROSE 2-3 GM-%(50ML) IV SOLR
INTRAVENOUS | Status: DC | PRN
  Administered 2019-02-18: 2 g via INTRAVENOUS

## 2019-02-18 MED ORDER — OXYCODONE HCL 5 MG PO TABS: 5.0000 mg | ORAL_TABLET | Freq: Once | ORAL | Status: DC | PRN

## 2019-02-18 MED ORDER — PHENYLEPHRINE 40 MCG/ML (10ML) SYRINGE FOR IV PUSH (FOR BLOOD PRESSURE SUPPORT)
PREFILLED_SYRINGE | INTRAVENOUS | Status: AC
  Filled 2019-02-18: qty 10

## 2019-02-18 MED ORDER — ONDANSETRON HCL 4 MG/2ML IJ SOLN
INTRAMUSCULAR | Status: DC | PRN
  Administered 2019-02-18: 4 mg via INTRAVENOUS

## 2019-02-18 MED ORDER — DIPHENHYDRAMINE HCL 25 MG PO CAPS
25.0000 mg | ORAL_CAPSULE | ORAL | Status: DC | PRN
  Administered 2019-02-18 – 2019-02-19 (×3): 25 mg via ORAL
  Filled 2019-02-18 (×3): qty 1

## 2019-02-18 MED ORDER — MORPHINE SULFATE (PF) 0.5 MG/ML IJ SOLN
INTRAMUSCULAR | Status: DC | PRN
  Administered 2019-02-18: 150 ug via INTRATHECAL

## 2019-02-18 MED ORDER — MIDAZOLAM HCL 2 MG/2ML IJ SOLN
INTRAMUSCULAR | Status: AC
  Filled 2019-02-18: qty 2

## 2019-02-18 MED ORDER — MENTHOL 3 MG MT LOZG: 1.0000 | LOZENGE | OROMUCOSAL | Status: DC | PRN

## 2019-02-18 MED ORDER — OXYTOCIN 10 UNIT/ML IJ SOLN
INTRAMUSCULAR | Status: DC | PRN
  Administered 2019-02-18: 40 [IU]

## 2019-02-18 MED ORDER — ONDANSETRON HCL 4 MG/2ML IJ SOLN
INTRAMUSCULAR | Status: AC
  Filled 2019-02-18: qty 2

## 2019-02-18 MED ORDER — SENNOSIDES-DOCUSATE SODIUM 8.6-50 MG PO TABS
2.0000 | ORAL_TABLET | ORAL | Status: DC
  Administered 2019-02-19 – 2019-02-21 (×3): 2 via ORAL
  Filled 2019-02-18 (×3): qty 2

## 2019-02-18 MED ORDER — ONDANSETRON HCL 4 MG/2ML IJ SOLN: 4.0000 mg | Freq: Three times a day (TID) | INTRAMUSCULAR | Status: DC | PRN

## 2019-02-18 MED ORDER — OXYTOCIN 40 UNITS IN NORMAL SALINE INFUSION - SIMPLE MED
INTRAVENOUS | Status: AC
  Filled 2019-02-18: qty 1000

## 2019-02-18 MED ORDER — WITCH HAZEL-GLYCERIN EX PADS: 1.0000 "application " | MEDICATED_PAD | CUTANEOUS | Status: DC | PRN

## 2019-02-18 MED ORDER — DIPHENHYDRAMINE HCL 50 MG/ML IJ SOLN
12.5000 mg | INTRAMUSCULAR | Status: DC | PRN
  Administered 2019-02-18: 12.5 mg via INTRAVENOUS
  Filled 2019-02-18: qty 1

## 2019-02-18 SURGICAL SUPPLY — 42 items
APL SKNCLS STERI-STRIP NONHPOA (GAUZE/BANDAGES/DRESSINGS) ×1
BENZOIN TINCTURE PRP APPL 2/3 (GAUZE/BANDAGES/DRESSINGS) ×2 IMPLANT
CHLORAPREP W/TINT 26ML (MISCELLANEOUS) ×3 IMPLANT
CLAMP CORD UMBIL (MISCELLANEOUS) IMPLANT
CLIP FILSHIE TUBAL LIGA STRL (Clip) ×2 IMPLANT
CLOSURE STERI STRIP 1/2 X4 (GAUZE/BANDAGES/DRESSINGS) ×1 IMPLANT
CLOSURE WOUND 1/2 X4 (GAUZE/BANDAGES/DRESSINGS) ×1
CLOTH BEACON ORANGE TIMEOUT ST (SAFETY) ×3 IMPLANT
DRSG OPSITE POSTOP 4X10 (GAUZE/BANDAGES/DRESSINGS) ×5 IMPLANT
ELECT REM PT RETURN 9FT ADLT (ELECTROSURGICAL) ×3
ELECTRODE REM PT RTRN 9FT ADLT (ELECTROSURGICAL) ×1 IMPLANT
EXTRACTOR VACUUM KIWI (MISCELLANEOUS) IMPLANT
GLOVE BIOGEL PI IND STRL 7.0 (GLOVE) ×1 IMPLANT
GLOVE BIOGEL PI IND STRL 9 (GLOVE) ×1 IMPLANT
GLOVE BIOGEL PI INDICATOR 7.0 (GLOVE) ×2
GLOVE BIOGEL PI INDICATOR 9 (GLOVE) ×2
GLOVE SS PI 9.0 STRL (GLOVE) ×3 IMPLANT
GOWN STRL REUS W/TWL 2XL LVL3 (GOWN DISPOSABLE) ×3 IMPLANT
GOWN STRL REUS W/TWL LRG LVL3 (GOWN DISPOSABLE) ×3 IMPLANT
HEMOSTAT ARISTA ABSORB 3G PWDR (HEMOSTASIS) ×2 IMPLANT
NDL HYPO 25X5/8 SAFETYGLIDE (NEEDLE) IMPLANT
NEEDLE HYPO 25X5/8 SAFETYGLIDE (NEEDLE) IMPLANT
NS IRRIG 1000ML POUR BTL (IV SOLUTION) ×3 IMPLANT
PACK C SECTION WH (CUSTOM PROCEDURE TRAY) ×3 IMPLANT
PAD OB MATERNITY 4.3X12.25 (PERSONAL CARE ITEMS) ×3 IMPLANT
PENCIL SMOKE EVAC W/HOLSTER (ELECTROSURGICAL) ×3 IMPLANT
RTRCTR C-SECT PINK 25CM LRG (MISCELLANEOUS) IMPLANT
RTRCTR C-SECT PINK 34CM XLRG (MISCELLANEOUS) IMPLANT
STRIP CLOSURE SKIN 1/2X4 (GAUZE/BANDAGES/DRESSINGS) ×1 IMPLANT
SUT MNCRL 0 VIOLET CTX 36 (SUTURE) ×2 IMPLANT
SUT MONOCRYL 0 CTX 36 (SUTURE) ×4
SUT PLAIN 2 0 (SUTURE) ×6
SUT PLAIN ABS 2-0 CT1 27XMFL (SUTURE) IMPLANT
SUT VIC AB 0 CT1 27 (SUTURE) ×3
SUT VIC AB 0 CT1 27XBRD ANBCTR (SUTURE) ×1 IMPLANT
SUT VIC AB 2-0 CT1 27 (SUTURE) ×3
SUT VIC AB 2-0 CT1 TAPERPNT 27 (SUTURE) ×1 IMPLANT
SUT VIC AB 4-0 KS 27 (SUTURE) ×3 IMPLANT
SYR BULB IRRIGATION 50ML (SYRINGE) IMPLANT
TOWEL OR 17X24 6PK STRL BLUE (TOWEL DISPOSABLE) ×3 IMPLANT
TRAY FOLEY W/BAG SLVR 14FR LF (SET/KITS/TRAYS/PACK) ×3 IMPLANT
WATER STERILE IRR 1000ML POUR (IV SOLUTION) ×3 IMPLANT
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
## 2019-02-18 NOTE — Anesthesia Preprocedure Evaluation (Signed)
Anesthesia Evaluation  Patient identified by MRN, date of birth, ID band Patient awake    Reviewed: Allergy & Precautions, H&P , NPO status , Patient's Chart, lab work & pertinent test results, reviewed documented beta blocker date and time   History of Anesthesia Complications Negative for: history of anesthetic complications  Airway Mallampati: II  TM Distance: >3 FB Neck ROM: full    Dental  (+) Teeth Intact   Pulmonary neg pulmonary ROS, former smoker,    breath sounds clear to auscultation       Cardiovascular hypertension, negative cardio ROS   Rhythm:regular Rate:Normal     Neuro/Psych PSYCHIATRIC DISORDERS (ADHD, anxiety, oppositional defiant disorder, depression) negative neurological ROS     GI/Hepatic negative GI ROS, Neg liver ROS, (+)     substance abuse (polysubstance abuse)  alcohol use and marijuana use,   Endo/Other  negative endocrine ROS  Renal/GU negative Renal ROS  negative genitourinary   Musculoskeletal   Abdominal   Peds  Hematology negative hematology ROS (+)   Anesthesia Other Findings   Reproductive/Obstetrics (+) Pregnancy                             Anesthesia Physical  Anesthesia Plan  ASA: II  Anesthesia Plan: Spinal   Post-op Pain Management:    Induction:   PONV Risk Score and Plan: 2 and Treatment may vary due to age or medical condition  Airway Management Planned: Natural Airway  Additional Equipment:   Intra-op Plan:   Post-operative Plan:   Informed Consent: I have reviewed the patients History and Physical, chart, labs and discussed the procedure including the risks, benefits and alternatives for the proposed anesthesia with the patient or authorized representative who has indicated his/her understanding and acceptance.       Plan Discussed with:   Anesthesia Plan Comments:         Anesthesia Quick Evaluation

## 2019-02-18 NOTE — Discharge Summary (Signed)
Postpartum Discharge Summary      Patient Name: Morgan Lewis DOB: Feb 06, 1996 MRN: [id number]  Date of admission: 02/18/2019 Delivering Provider: Jonnie Kind   Date of discharge: 02/21/2019  Admitting diagnosis: preg Intrauterine pregnancy: [redacted]w[redacted]d    Secondary diagnosis:  Active Problems:   Depression, major, single episode, moderate (HCC)   Oppositional defiant disorder   Attention-deficit hyperactivity disorder, combined type   Generalized anxiety disorder   Rh negative state in antepartum period   Previous cesarean section   Cannabis dependence with current use ([place]   [redacted] weeks gestation of pregnancy   Labor and delivery, indication for care  Additional problems: None     Discharge diagnosis: Term Pregnancy Delivered                                                                                                Post partum procedures:postpartum tubal ligation  Augmentation: NA  Complications: None  Hospital course:  Sceduled C/S   23y.o. yo [id number] admitted to the hospital 02/18/2019 for scheduled cesarean section with the following indication:Elective Repeat.  Membrane Rupture Time/Date: 9:04 AM ,02/18/2019   Patient delivered a Viable infant.02/18/2019  Details of operation can be found in separate operative note. Pateint had an uncomplicated postpartum course. BTL done during operation. Social work [place].  She is ambulating, tolerating a regular diet, passing flatus, and urinating well. Patient is discharged home in stable condition on  02/21/19        Delivery time: 9:04 AM    Magnesium Sulfate received: No BMZ received: No Rhophylac:Yes MMR:No Transfusion:No  Physical exam  Vitals:   02/20/19 0750 02/20/19 1433 02/20/19 2147 02/21/19 0558  BP: 112/78 117/84 125/85 120/83  Pulse: 67 78 69 93  Resp: 18 16 16 18   Temp: 98.5 F (36.9 C) 98.5 F (36.9 C) 98.2 F (36.8 C) 98.2 F (36.8 C)  TempSrc: Oral  Oral Oral Oral  SpO2: 98% 99% 99% 99%  Weight:      Height:       General: alert, cooperative and no distress Lochia: appropriate Uterine Fundus: firm Incision: Healing well with no significant drainage DVT Evaluation: No evidence of DVT seen on physical exam. Labs: Lab Results  Component Value Date   WBC 11.7 (H) 02/19/2019   HGB 8.7 (L) 02/19/2019   HCT 25.3 (L) 02/19/2019   MCV 86.9 02/19/2019   PLT 223 02/19/2019   CMP Latest Ref Rng & Units 02/19/2019  Glucose 70 - 99 mg/dL -  BUN 6 - 20 mg/dL -  Creatinine 0.44 - 1.00 mg/dL 0.68  Sodium 135 - 145 mmol/L -  Potassium 3.5 - 5.1 mmol/L -  Chloride 98 - 111 mmol/L -  CO2 22 - 32 mmol/L -  Calcium 8.9 - 10.3 mg/dL -  Total Protein 6.5 - 8.1 g/dL -  Total Bilirubin 0.3 - 1.2 mg/dL -  Alkaline Phos 38 - 126 U/L -  AST 15 - 41 U/L -  ALT 0 - 44 U/L -    Discharge instruction: per After Visit  Summary and "Baby and Me Booklet".  After visit meds:  Allergies as of 02/21/2019   No Known Allergies     Medication List    TAKE these medications   Blood Pressure Monitoring Kit 1 kit by Does not apply route once a week.   ibuprofen 800 MG tablet Commonly known as: ADVIL Take 1 tablet (800 mg total) by mouth every 8 (eight) hours as needed.   oxyCODONE-acetaminophen 5-325 MG tablet Commonly known as: Percocet Take 1 tablet by mouth every 4 (four) hours as needed for severe pain.       Diet: routine diet  Activity: Advance as tolerated. Pelvic rest for 6 weeks.   Outpatient follow up:4 weeks Follow up Appt: Future Appointments  Date Time Provider [place]  02/24/2019 11:10 AM Jonnie Kind, MD [facility]  03/27/2019  2:10 PM Roma Schanz, CNM [facility]   Follow up Visit:   Please schedule this patient for Postpartum visit in: 4 weeks with the following provider: Any provider For C/S patients schedule nurse incision check in weeks 2 weeks: yes High risk pregnancy complicated by:  2 prior sections Delivery mode:  CS Anticipated Birth Control:  BTL done PP PP Procedures needed: Incision check  Schedule Integrated [place] visit: yes      Newborn Data: Live born female  Birth Weight: 3195 g  APGAR: 8/9  Newborn Delivery   Birth date/time: 02/18/2019 09:04:00 Delivery type: C-Section, Low Transverse Trial of labor: No C-section categorization: Repeat      Baby Feeding: Bottle Disposition:home with mother, CPS referral    Marcille Buffy DNP, CNM  02/21/19  10:23 AM

## 2019-02-18 NOTE — Brief Op Note (Addendum)
02/18/2019  9:55 AM  PATIENT:  Morgan Lewis  23 y.o. female  PRE-OPERATIVE DIAGNOSIS: Pregnancy 39 weeks previous cesarean section x3 not for trial of labor, desire for elective sterilization RCS with BTL Patient anxiety POST-OPERATIVE DIAGNOSIS: Same, delivered  Repeat CS with BTL Patient anxiety, controlled PROCEDURE:  Procedure(s): CESAREAN SECTION (N/A) repeat low transverse cervical cesarean section bilateral sterilization by Filshie clip placement  wide excision of old C-section scar (no charge) Release of bladder flap adhesions SURGEON:  Surgeon(s) and Role:    * Emelda Fear, Cyndi Lennert, MD - Primary       * Fair, Hoyle Sauer, MD - Fellow  PHYSICIAN ASSISTANT:   ASSISTANTS: none   ANESTHESIA:   spinal  EBL:  700 cc   BLOOD ADMINISTERED:none  DRAINS: Urinary Catheter (Foley)   LOCAL MEDICATIONS USED:  Amount: None ml  SPECIMEN:  Source of Specimen:  Placenta to pathology also an area of what appeared to be unusually thickened decidua that swabbed out during swabbing of the uterus sent to pathology to rule out fetus papyraceous are other pathology  DISPOSITION OF SPECIMEN:  PATHOLOGY  COUNTS:  YES  TOURNIQUET:  * No tourniquets in log *  DICTATION: .Dragon Dictation  PLAN OF CARE: Admit to inpatient   PATIENT DISPOSITION:  PACU - hemodynamically stable.   Delay start of Pharmacological VTE agent (>24hrs) due to surgical blood loss or risk of bleeding: not applicable Indications 23 year old gravida 4 para 3 previous cesarean x3 scheduled for repeat cesarean section.  There was a lot of social drama in the preop area Nonetheless patient was very calm, and  confident in her statement that she wanted her tubes tied.   Patient was taken to the operating room prepped and draped lower abdominal surgery planned.  The old skin incision was marked for wide excision.  A 20 cm wide by 5 cm vertical width ellipse of skin and subcutaneous fatty tissue was excised.  The fascia  was opened transversely.  The fascia was sharply dissected off of the fibrotic rectus muscles.  Midline peritoneal cavity entry was performed carefully.  The upper abdomen did not have adhesions and the omentum was not attached to the anterior abdominal wall.  Bowel was well out of the way.  The bladder flap area had some significant adhesions.  We first made made careful efforts to free the bladder up from its attachments to the anterior peritoneum, and then carefully dissected down to develop a bladder flap on anterior uterus.  Transverse uterine incision was made over a thinned area of the lower uterine segment.  There was evidence of a prior C-section scar a bit higher than the place we chose transverse incision was without difficulty extended cephalad and caudad, and the fetal vertex delivered.  Cord clamped at 1 minute.  See pediatricians notes for further details on the baby which did well.  Placenta delivered with some difficulty and it was noted that there was unusually thickened soft deciduous type tissue in the uterine fundus that was extracted with swabbing.  It was approximately 10 cm area of decidual tissue that came out intact with additional fragments.  This was sent to pathology to see if it was simply decidua or perhaps fetus papyraceous.  Uterus was closed in a single layer running locking closure.  Hemostasis was relatively good but due to the dissection we used some Arista in the bladder flap area.  Tubal ligation tubal ligation consisted of identifying each fallopian tube to its fimbriated end and  placing a Filshie clip at the midportion of the tube.  Basement was confirmed visually upon completion of tubal ligation.  Anterior peritoneum was closed after removal of all laparotomy equipment, using 2-0 Vicryl.  The fascia was closed using running 0 Vicryl.  Subcutaneous tissues were closed with a series of horizontal mattress sutures of 2-0 plain on a large needle resulting in good skin edge  approximation and then subcuticular 4-0 Vicryl used to close the skin with a Lanny Hurst needle.

## 2019-02-18 NOTE — Progress Notes (Signed)
Pt was lying, sitting, standing for orthostatic blood pressures and ambulating, Blood pressures were normal at lying 105/78, sitting 121/87, and standing 108/63 - when patient began moving to bathroom she lost the pink color to her lips. I called for help and sat patient down on the toliet. The steady was used to get pt back in bed. Blood pressure was retaken after she laid down resulting at 118/59. She regained pink to her lips and stated she felt a lot better.63. Blood in the toilet was scant, size of a nickel.

## 2019-02-18 NOTE — Progress Notes (Signed)
Pt has been reminded to place baby in crib when pt is feeling as if she is falling asleep and if she cannot reach crib to call out to nurse's station. Warning baby is not safe in pt's bed while pt is asleep. Pt states she understands risks and will not fall asleep with baby in bed with her.

## 2019-02-18 NOTE — Anesthesia Postprocedure Evaluation (Signed)
Anesthesia Post Note  Patient: Jashiya Bassett  Procedure(s) Performed: CESAREAN SECTION (N/A )     Patient location during evaluation: PACU Anesthesia Type: Spinal Level of consciousness: oriented and awake and alert Pain management: pain level controlled Vital Signs Assessment: post-procedure vital signs reviewed and stable Respiratory status: spontaneous breathing and respiratory function stable Cardiovascular status: blood pressure returned to baseline and stable Postop Assessment: no headache, no backache and no apparent nausea or vomiting Anesthetic complications: no    Last Vitals:  Vitals:   02/18/19 1130 02/18/19 1145  BP: 117/76 117/67  Pulse: 71 72  Resp: 18 16  Temp:    SpO2: 100% 97%    Last Pain:  Vitals:   02/18/19 1130  TempSrc:   PainSc: Asleep   Pain Goal: Patients Stated Pain Goal: 0 (02/18/19 0612)              Epidural/Spinal Function Cutaneous sensation: Normal sensation (02/18/19 1145), Patient able to flex knees: Yes (02/18/19 1145), Patient able to lift hips off bed: Yes (02/18/19 1145), Back pain beyond tenderness at insertion site: No (02/18/19 1145), Progressively worsening motor and/or sensory loss: No (02/18/19 1145), Bowel and/or bladder incontinence post epidural: No (02/18/19 1145)  Lynda Rainwater

## 2019-02-18 NOTE — Anesthesia Procedure Notes (Signed)
Spinal  Patient location during procedure: OB Start time: 02/18/2019 8:22 AM End time: 02/18/2019 8:27 AM Staffing Anesthesiologist: Lynda Rainwater, MD Performed: anesthesiologist  Preanesthetic Checklist Completed: patient identified, surgical consent, pre-op evaluation, timeout performed, IV checked, risks and benefits discussed and monitors and equipment checked Spinal Block Patient position: sitting Prep: site prepped and draped and DuraPrep Patient monitoring: heart rate, cardiac monitor, continuous pulse ox and blood pressure Approach: midline Location: L3-4 Injection technique: single-shot Needle Needle type: Pencan  Needle gauge: 24 G Needle length: 10 cm Assessment Sensory level: T4

## 2019-02-18 NOTE — Op Note (Signed)
02/18/2019  9:55 AM  PATIENT:  Morgan Lewis  23 y.o. female  PRE-OPERATIVE DIAGNOSIS: Pregnancy 39 weeks previous cesarean section x3 not for trial of labor, desire for elective sterilization RCS with BTL Patient anxiety POST-OPERATIVE DIAGNOSIS: Same, delivered  Repeat CS with BTL Patient anxiety, controlled PROCEDURE:  Procedure(s): CESAREAN SECTION (N/A) repeat low transverse cervical cesarean section bilateral sterilization by Filshie clip placement  wide excision of old C-section scar (no charge) Release of bladder flap adhesions SURGEON:  Surgeon(s) and Role:    * Emelda Fear, Cyndi Lennert, MD - Primary       * Fair, Hoyle Sauer, MD - Fellow  PHYSICIAN ASSISTANT:   ASSISTANTS: none   ANESTHESIA:   spinal  EBL:  700 cc   BLOOD ADMINISTERED:none  DRAINS: Urinary Catheter (Foley)   LOCAL MEDICATIONS USED:  Amount: None ml  SPECIMEN:  Source of Specimen:  Placenta to pathology also an area of what appeared to be unusually thickened decidua that swabbed out during swabbing of the uterus sent to pathology to rule out fetus papyraceous are other pathology  DISPOSITION OF SPECIMEN:  PATHOLOGY  COUNTS:  YES  TOURNIQUET:  * No tourniquets in log *  DICTATION: .Dragon Dictation  PLAN OF CARE: Admit to inpatient   PATIENT DISPOSITION:  PACU - hemodynamically stable.   Delay start of Pharmacological VTE agent (>24hrs) due to surgical blood loss or risk of bleeding: not applicable Indications 23 year old gravida 4 para 3 previous cesarean x3 scheduled for repeat cesarean section.  There was a lot of social drama in the preop area Nonetheless patient was very calm, and  confident in her statement that she wanted her tubes tied.   Patient was taken to the operating room prepped and draped lower abdominal surgery planned.  The old skin incision was marked for wide excision.  A 20 cm wide by 5 cm vertical width ellipse of skin and subcutaneous fatty tissue was excised.  The fascia  was opened transversely.  The fascia was sharply dissected off of the fibrotic rectus muscles.  Midline peritoneal cavity entry was performed carefully.  The upper abdomen did not have adhesions and the omentum was not attached to the anterior abdominal wall.  Bowel was well out of the way.  The bladder flap area had some significant adhesions.  We first made made careful efforts to free the bladder up from its attachments to the anterior peritoneum, and then carefully dissected down to develop a bladder flap on anterior uterus.  Transverse uterine incision was made over a thinned area of the lower uterine segment.  There was evidence of a prior C-section scar a bit higher than the place we chose transverse incision was without difficulty extended cephalad and caudad, and the fetal vertex delivered.  Cord clamped at 1 minute.  See pediatricians notes for further details on the baby which did well.  Placenta delivered with some difficulty and it was noted that there was unusually thickened soft deciduous type tissue in the uterine fundus that was extracted with swabbing.  It was approximately 10 cm area of decidual tissue that came out intact with additional fragments.  This was sent to pathology to see if it was simply decidua or perhaps fetus papyraceous.  Uterus was closed in a single layer running locking closure.  Hemostasis was relatively good but due to the dissection we used some Arista in the bladder flap area.  Tubal ligation tubal ligation consisted of identifying each fallopian tube to its fimbriated end and  placing a Filshie clip at the midportion of the tube.  Basement was confirmed visually upon completion of tubal ligation.  Anterior peritoneum was closed after removal of all laparotomy equipment, using 2-0 Vicryl.  The fascia was closed using running 0 Vicryl.  Subcutaneous tissues were closed with a series of horizontal mattress sutures of 2-0 plain on a large needle resulting in good skin edge  approximation and then subcuticular 4-0 Vicryl used to close the skin with a Keith needle.  

## 2019-02-18 NOTE — Transfer of Care (Signed)
Immediate Anesthesia Transfer of Care Note  Patient: Morgan Lewis  Procedure(s) Performed: CESAREAN SECTION (N/A )  Patient Location: PACU  Anesthesia Type:Spinal  Level of Consciousness: awake, alert  and oriented  Airway & Oxygen Therapy: Patient Spontanous Breathing  Post-op Assessment: Report given to RN and Post -op Vital signs reviewed and stable  Post vital signs: Reviewed and stable  Last Vitals:  Vitals Value Taken Time  BP 131/74 02/18/19 1019  Temp    Pulse 59 02/18/19 1021  Resp 10 02/18/19 1021  SpO2 99 % 02/18/19 1021  Vitals shown include unvalidated device data.  Last Pain:  Vitals:   02/18/19 0612  TempSrc: Oral      Patients Stated Pain Goal: 0 (16/10/96 0454)  Complications: No apparent anesthesia complications

## 2019-02-19 ENCOUNTER — Encounter (HOSPITAL_COMMUNITY): Payer: Self-pay | Admitting: *Deleted

## 2019-02-19 LAB — CREATININE, SERUM
Creatinine, Ser: 0.68 mg/dL (ref 0.44–1.00)
GFR calc Af Amer: >60 mL/min (ref >60)
GFR calc non Af Amer: >60 mL/min (ref >60)

## 2019-02-19 LAB — CBC
HCT: 25.3 % — ABNORMAL LOW (ref 36.0–46.0)
Hemoglobin: 8.7 g/dL — ABNORMAL LOW (ref 12.0–15.0)
MCH: 29.9 pg (ref 26.0–34.0)
MCHC: 34.4 g/dL (ref 30.0–36.0)
MCV: 86.9 fL (ref 80.0–100.0)
Platelets: 223 10*3/uL (ref 150–400)
RBC: 2.91 MIL/uL — ABNORMAL LOW (ref 3.87–5.11)
RDW: 13.6 % (ref 11.5–15.5)
WBC: 11.7 10*3/uL — ABNORMAL HIGH (ref 4.0–10.5)
nRBC: 0 % (ref 0.0–0.2)

## 2019-02-19 MED ORDER — SODIUM CHLORIDE 0.9 % IV SOLN
510.0000 mg | Freq: Once | INTRAVENOUS | Status: AC
  Administered 2019-02-19: 13:00:00 510 mg via INTRAVENOUS
  Filled 2019-02-19: qty 17

## 2019-02-19 NOTE — Clinical Social Work Note (Signed)
  Clinical Social Work Assessment  Patient Details  Name: Morgan Lewis MRN: [id number] Date of Birth: 02/18/2020  Date of referral:  02/18/2019             Reason for consult:  History of Depression/Anxiety and THC use during pregnancy.              Permission sought to share information with:  Noone Permission granted to share information::  None  Name::        Agency::     Relationship::     Contact Information:     Housing/Transportation Living arrangements for the past 2 months:   Lives alone with 31-year-old son, Janifer Adie in low-income housing. Source of Information:  MOB Patient Interpreter Needed:  None Criminal Activity/Legal Involvement Pertinent to Current Situation/Hospitalization:  MOB denies. Significant Relationships:  Boyfriend/FOB - Darrius Armandina Gemma Lives with:  62-year-old son, Janifer Adie Do you feel safe going back to the place where you live?  Yes Need for family participation in patient care:  No  Care giving concerns:  None identified by MOB.   Social Worker assessment / plan:  Please see CSW Maternal Assessment.  Employment status:  Unemployed Forensic scientist:  Medicaid PT Recommendations:  None Information / Referral to community resources:  Resources provided, MOB not receptive.  Patient/Family's Response to care:  Congruent   Patient/Family's Understanding of and Emotional Response to Diagnosis, Current Treatment, and Prognosis:  Normal  Emotional Assessment Appearance:  Normal  Attitude/Demeanor/Rapport:  Passive  Affect (typically observed):  Normal Orientation:  Oriented to time, place, person, etc. Alcohol / Substance use:  THC Psych involvement (Current and /or in the community):  None  Discharge Needs  Concerns to be addressed:  Referral to Child Protective Services with the [facility]. Readmission within the last 30 days:  N/A Current discharge risk:  MOB reminded of  importance of not sleeping with baby in the bed (SIDS). Barriers to Discharge:  Pending Child Protective Services consult.   [place], Marta Lamas, LCSW 02/19/2019, 3:55 PM

## 2019-02-19 NOTE — Progress Notes (Addendum)
Subjective: Postpartum Day 1: Cesarean Delivery Patient reports doing okay. She is itchy and feels like she "needs to clean herself." One clot ~ 100 mL expelled earlier this AM otherwise lochia appropriate. Formula feeding. Foley still in place but has been standing up intermittently. Tolerating PO.   Objective: Vital signs in last 24 hours: Temp:  [97.6 F (36.4 C)-98.7 F (37.1 C)] 97.6 F (36.4 C) (11/15 0523) Pulse Rate:  [52-73] 57 (11/15 0523) Resp:  [15-20] 18 (11/15 0523) BP: (94-131)/(57-91) 122/88 (11/15 0523) SpO2:  [97 %-100 %] 100 % (11/15 0523)  Physical Exam:  General: alert, cooperative, appears stated age and no distress Lochia: appropriate Uterine Fundus: firm Incision: healing well, no significant drainage DVT Evaluation: No evidence of DVT seen on physical exam.  Recent Labs    02/17/19 1018 02/19/19 0533  HGB 11.9* 8.7*  HCT 34.4* 25.3*    Assessment/Plan: Status post Cesarean section. Doing well postoperatively.  Continue current care. Foley removal this AM and monitor UOP S/p BTL with Filshie Feraheme given due to Hgb drop Patient with GAD, MDD and complicated social situation; last child given up for adoption. SW consulted.  Needs Rhogam; baby O positive Possible DC 11/16 or 11/17. Vitals WNL. Post-partum Pap indicated  Chauncey Mann 02/19/2019, 7:56 AM

## 2019-02-19 NOTE — Clinical Social Work Maternal (Signed)
CLINICAL SOCIAL WORK MATERNAL/CHILD NOTE  Patient Details  Name: Jenica Costilow MRN: [id number] Date of Birth: 23-Nov-1995  Date:  [birth year]2019  Clinical Social Worker Initiating Note:  Danford Bad, LCSW Date/Time: Initiated:  02/19/2019 @ 3:06PM   Child's Name:  Tempie Hoist   Biological Parents:  Almyra Deforest & Darrius Renette Butters  Need for Interpreter:  None   Reason for Referral:   History of Depression/Anxiety and Cannabis Dependence    Address:  [address] [place] [address]    Phone number:  [phone] (home)     Additional phone number:  None, broke cell phone.  Household Members/Support Persons (HM/SP):       HM/SP Name Relationship DOB or Age  HM/SP -1  Colton Stanford  Son  3  HM/SP -2  Amora Product manager  Daughter   2 days  HM/SP -3        HM/SP -4        HM/SP -5        HM/SP -6        HM/SP -7        HM/SP -8          Natural Supports (not living in the home):  Adopted family, boyfriend.  Professional Supports: None  Employment: Unemployed  Type of Work: N/A  Education:  Dropped out in 12th grade year.  Homebound arranged: None  Financial Resources:  WIC, Sales executive, Medicaid  Other Resources:  N/A  Cultural/Religious Considerations Which May Impact Care:  None  Strengths:  Parenting  Psychotropic Medications:  None, denies need for antidepressant and antianxiety    Pediatrician:    [facility]  Pediatrician List:   [facility] [place]    [place]      Pediatrician Fax Number:    Risk Factors/Current Problems:  Patient denies.  Cognitive State:  Normal   Mood/Affect:  Normal   CSW Assessment:  MOB reported that she is "feeling great", despite having a little abdominal pain from casserian incision.  MOB denies experiencing active symptoms of anxiety and depression, nor does MOB feel homicidal or suicidal.  MOB reports  living at home alone with her 49-year-old son, Futures trader.  Patient admits to having a very good support system, through boyfriend and FOB, Darrius Renette Butters, as well as adoptive parents, various other family members and friends.  MOB indicated that she is currently unemployed but receives financial support through FOB, WIC, Medicaid and Sales executive.  MOB is in the process of applying for on-line work to be able to stay at home with both children.  MOB stated that she dropped out of school in the 12th grade, shortly after her father died, but that she plans to work toward earning her GED.  MOB denies alcohol and/or drug use/abuse during pregnancy, or Child Protective Services involvement.  However, Rapid Urine Drug Screen, performed on 02/18/2019, indicated positive result for Tetrahydrocannabinol.  CSW placed a referral to Child Protective Services at the [facility] of Social Services and made MOB aware of this referral.  Drug Detection Panel, Umbilical Cord Qualitative screening, performed on 02/18/2019 at 11:00AM, results are still pending.      CSW noted that patient has been diagnosed with Depression, Major, Single Episode, Moderate; Oppositional Defiant Disorder, Attention-Deficit Hyperactivity Disorder, Combined Type, Generalized Anxiety Disorder and Mental Disorders of Biological Mother.  CSW further noted that  patient had late prenatal care and limited prenatal visits, in addition to Cannabis Dependence with current use.  MOB indicated that both of her biological parents were drug abusers, hence the reason for her adoption.  CSW provided MOB with information about SIDS, as CSW is aware that MOB has been consulted several times, by her attending nurse, to not fall asleep with the baby in her bed.  MOB indicated that she has a crib, clothing, car seat and formula for newborn.  MOB did not wish to seek counseling and supportive services for symptoms of anxiety and depression, denying active  symptoms.  MOB does not wish to take antidepressant and/or antianxiety medications.  MOB stated, "I know what Post-Partum Depression is, I have experienced it, but I am not having that now".  CSW Plan/Description:    Referral to Child Scientist, forensic, at the [facility], for [facility] use during pregnancy.  Information on SIDS Information on [facility] Services of the [place] on Perinatal Mood Disorders Information on [place] on Drug Exposed Newborn Intervention Information on [place], [place], [place] 02/19/2019, 2:55 PM

## 2019-02-20 MED ORDER — RHO D IMMUNE GLOBULIN 1500 UNIT/2ML IJ SOSY
300.0000 ug | PREFILLED_SYRINGE | Freq: Once | INTRAMUSCULAR | Status: AC
  Administered 2019-02-20: 300 ug via INTRAVENOUS
  Filled 2019-02-20: qty 2

## 2019-02-20 NOTE — Progress Notes (Signed)
Patient has been educated repeatedly during her stay, by nurses and technicians, the danger of sleeping with her baby in bed with her, that the best place for the baby when mom is sleeping is in the crib.  Mom remains noncompliant; often found sleeping with baby next to her, or propped up on a pillow at foot of bed.

## 2019-02-20 NOTE — Progress Notes (Signed)
Subjective: Postpartum Day 2: Cesarean Delivery Patient reports doing okay. She is itchy and feels like she "needs to clean herself." One clot ~ 100 mL expelled earlier this AM otherwise lochia appropriate. Formula feeding. Foley still in place but has been standing up intermittently. Tolerating PO.   Objective: Vital signs in last 24 hours: Temp:  [97.6 F (36.4 C)-98.4 F (36.9 C)] 98.4 F (36.9 C) (11/15 0904) Pulse Rate:  [55-57] 55 (11/15 0904) Resp:  [16-18] 16 (11/15 0904) BP: (106-122)/(68-88) 106/68 (11/15 0904) SpO2:  [100 %] 100 % (11/15 0523)  Physical Exam:  General: alert, cooperative, appears stated age and no distress Lochia: appropriate Uterine Fundus: firm Incision: healing well, no significant drainage DVT Evaluation: No evidence of DVT seen on physical exam.  Recent Labs    02/17/19 1018 02/19/19 0533  HGB 11.9* 8.7*  HCT 34.4* 25.3*    Assessment/Plan: Status post Cesarean section. Doing well postoperatively.  Continue current care. Appropriate UOP S/p BTL with Filshie Feraheme given due to Hgb drop SW saw patient yesterday; referral to CPS - awaiting further recs Rhogam given Likely DC 11/17. Vitals WNL. Post-partum Pap indicated  Morgan Lewis Morgan Lewis 02/20/2019, 4:10 AM

## 2019-02-21 LAB — TYPE AND SCREEN
ABO/RH(D): O NEG
Antibody Screen: POSITIVE
Unit division: 0
Unit division: 0

## 2019-02-21 LAB — BPAM RBC
Blood Product Expiration Date: 202012082359
Blood Product Expiration Date: 202012082359
Unit Type and Rh: 9500
Unit Type and Rh: 9500

## 2019-02-21 LAB — RH IG WORKUP (INCLUDES ABO/RH)
ABO/RH(D): O NEG
Fetal Screen: NEGATIVE
Gestational Age(Wks): 39
Unit division: 0

## 2019-02-21 LAB — SURGICAL PATHOLOGY

## 2019-02-21 MED ORDER — OXYCODONE-ACETAMINOPHEN 5-325 MG PO TABS: 1.0000 | ORAL_TABLET | ORAL | 0 refills | Status: AC | PRN

## 2019-02-21 MED ORDER — IBUPROFEN 800 MG PO TABS: 800.0000 mg | ORAL_TABLET | Freq: Three times a day (TID) | ORAL | 0 refills | Status: AC | PRN

## 2019-02-21 NOTE — Progress Notes (Signed)
CSW aware assessment completed by Weekend CSW for [facility] use and mental health history. Per notes, Weekend CSW made CPS report due to positive UDS for THC. No barriers to discharge, at this time. CPS to follow up within 72 hours.   Elijio Miles, [place]  Women's and [employer] [phone]

## 2019-02-22 NOTE — Progress Notes (Signed)
Me either

## 2019-02-24 ENCOUNTER — Encounter: Payer: Medicaid Other | Admitting: Obstetrics and Gynecology

## 2019-03-27 ENCOUNTER — Ambulatory Visit: Payer: Medicaid Other | Admitting: Women's Health

## 2019-08-25 ENCOUNTER — Emergency Department (HOSPITAL_COMMUNITY): Payer: Medicaid Other

## 2019-08-25 ENCOUNTER — Emergency Department (HOSPITAL_COMMUNITY)
Admission: EM | Admit: 2019-08-25 | Discharge: 2019-08-25 | Disposition: A | Discharge status: Home / Self Care | Payer: Medicaid Other | Attending: Emergency Medicine | Admitting: Emergency Medicine

## 2019-08-25 ENCOUNTER — Encounter (HOSPITAL_COMMUNITY): Payer: Self-pay

## 2019-08-25 ENCOUNTER — Other Ambulatory Visit: Payer: Self-pay

## 2019-08-25 DIAGNOSIS — W108XXA Fall (on) (from) other stairs and steps, initial encounter: Secondary | ICD-10-CM | POA: Diagnosis not present

## 2019-08-25 DIAGNOSIS — Y929 Unspecified place or not applicable: Secondary | ICD-10-CM | POA: Diagnosis not present

## 2019-08-25 DIAGNOSIS — M25511 Pain in right shoulder: Secondary | ICD-10-CM | POA: Insufficient documentation

## 2019-08-25 DIAGNOSIS — Y999 Unspecified external cause status: Secondary | ICD-10-CM | POA: Insufficient documentation

## 2019-08-25 DIAGNOSIS — M546 Pain in thoracic spine: Secondary | ICD-10-CM | POA: Insufficient documentation

## 2019-08-25 DIAGNOSIS — M25512 Pain in left shoulder: Secondary | ICD-10-CM | POA: Diagnosis not present

## 2019-08-25 DIAGNOSIS — W19XXXA Unspecified fall, initial encounter: Secondary | ICD-10-CM | POA: Diagnosis not present

## 2019-08-25 DIAGNOSIS — Y9389 Activity, other specified: Secondary | ICD-10-CM | POA: Diagnosis not present

## 2019-08-25 DIAGNOSIS — M25519 Pain in unspecified shoulder: Secondary | ICD-10-CM | POA: Diagnosis not present

## 2019-08-25 DIAGNOSIS — R519 Headache, unspecified: Secondary | ICD-10-CM | POA: Diagnosis not present

## 2019-08-25 DIAGNOSIS — R52 Pain, unspecified: Secondary | ICD-10-CM | POA: Diagnosis not present

## 2019-08-25 MED ORDER — ACETAMINOPHEN 500 MG PO TABS
1000.0000 mg | ORAL_TABLET | Freq: Once | ORAL | Status: AC
  Administered 2019-08-25: 1000 mg via ORAL
  Filled 2019-08-25: qty 2

## 2019-08-25 NOTE — ED Provider Notes (Signed)
[place] DEPT Provider Note  CSN: [id number] Arrival date & time: 08/25/19 0124  Chief Complaint(s) Arm Pain  HPI [name] is a 24 y.o. female who presents by [employer] for right arm pain.  Patient reports that she tripped down a flight of stairs earlier this morning.  Since then she has been having shoulder pain that is moderate to severe in intensity worse with range of motion and palpation of the right shoulder.  Patient reports that she has been using the arm since the fall because she 'has to take care of her baby.'  Patient is also endorsing upper back pain and headache.  No other extremity pain.  No other physical complaints.  HPI  Past Medical History Past Medical History:  Diagnosis Date  . ADHD (attention deficit hyperactivity disorder) 04/17/2013  . Anemia   . 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 Depression    has never taken meds  . Generalized anxiety disorder   . Headache   . Hypertension   . Obesity   . Supervision of high risk pregnancy, antepartum 12/03/2016    Clinic  [facility] Prenatal Labs Dating  by lmp 03/23/17  Blood type: O/Negative/-- (08/30 0920)  Genetic Screen Too late Antibody:Negative (08/30 0920) Anatomic [place]  Rubella: 1.14 (08/30 0920) GTT Third trimester:  RPR: Non Reactive (08/30 0920)  Flu vaccine  HBsAg: Negative (08/30 0920)  TDaP vaccine                                              HIV:   Negative Baby Food   undecided                    Patient Active Problem List   Diagnosis Date Noted  . [redacted] weeks gestation of pregnancy 02/18/2019  . Labor and delivery, indication for care 02/18/2019  . Cannabis dependence with current use ([place]) 10/31/2018  . Late prenatal care affecting pregnancy in second trimester 10/26/2018  . Supervision of other normal pregnancy, antepartum 10/25/2018  . Previous cesarean section 12/03/2016  . ADHD (attention deficit hyperactivity disorder) 04/17/2013  .  Cholecystitis 04/15/2013  . Rh negative state in antepartum period 01/25/2013  . Mental disorders of mother, antepartum 11/08/2012  . Generalized anxiety disorder 08/27/2011  . 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 Depression, major, single episode, moderate ([place]) 08/24/2011  . Oppositional defiant disorder 08/24/2011   Home Medication(s) Prior to Admission medications   Medication Sig Start Date End Date Taking? Authorizing Provider  Blood Pressure Monitoring KIT 1 kit by Does not apply route once a week. 10/25/18   Lajean Manes, CNM  ibuprofen (ADVIL) 800 MG tablet Take 1 tablet (800 mg total) by mouth every 8 (eight) hours as needed. 02/21/19   Marcille Buffy D, CNM  oxyCODONE-acetaminophen (PERCOCET) 5-325 MG tablet Take 1 tablet by mouth every 4 (four) hours as needed for severe pain. 02/21/19 02/21/20  Tresea Mall, CNM  Past Surgical History Past Surgical History:  Procedure Laterality Date  . CESAREAN SECTION N/A 03/15/2013   Procedure: CESAREAN SECTION;  Surgeon: Mora Bellman, MD;  Location: [facility];  Service: Obstetrics;  Laterality: N/A;  . CESAREAN SECTION N/A 02/18/2019   Procedure: CESAREAN SECTION;  Surgeon: Jonnie Kind, MD;  Location: [facility];  Service: Obstetrics;  Laterality: N/A;  . CHOLECYSTECTOMY N/A 04/16/2013   Procedure: LAPAROSCOPIC CHOLECYSTECTOMY WITH INTRAOPERATIVE CHOLANGIOGRAM;  Surgeon: Odis Hollingshead, MD;  Location: [facility];  Service: General;  Laterality: N/A;   Family History Family History  Adopted: Yes  Problem Relation Age of Onset  . Drug abuse Mother   . Drug abuse Father     Social History Social History   Tobacco Use  . Smoking status: Former Smoker    Types: Cigars  . Smokeless tobacco: Never Used  . Tobacco comment: black and mild quit 2018  Substance Use  Topics  . Alcohol use: Not Currently  . Drug use: Not Currently    Types: Marijuana    Comment: couple days ago   Allergies Patient has no known allergies.  Review of Systems Review of Systems All other systems are reviewed and are negative for acute change except as noted in the HPI  Physical Exam Vital Signs  I have reviewed the triage vital signs BP 112/76   Pulse 77   Temp 98.1 F (36.7 C)   Resp 16   SpO2 99%   Physical Exam Constitutional:      General: She is not in acute distress.    Appearance: She is well-developed. She is not diaphoretic.  HENT:     Head: Normocephalic and atraumatic.     Right Ear: External ear normal.     Left Ear: External ear normal.     Nose: Nose normal.  Eyes:     General: No scleral icterus.       Right eye: No discharge.        Left eye: No discharge.     Conjunctiva/sclera: Conjunctivae normal.     Pupils: Pupils are equal, round, and reactive to light.  Cardiovascular:     Rate and Rhythm: Normal rate and regular rhythm.     Pulses:          Radial pulses are 2+ on the right side and 2+ on the left side.       Dorsalis pedis pulses are 2+ on the right side and 2+ on the left side.     Heart sounds: Normal heart sounds. No murmur. No friction rub. No gallop.   Pulmonary:     Effort: Pulmonary effort is normal. No respiratory distress.     Breath sounds: Normal breath sounds. No stridor. No wheezing.  Abdominal:     General: There is no distension.     Palpations: Abdomen is soft.     Tenderness: There is no abdominal tenderness.  Musculoskeletal:     Right shoulder: Tenderness present. Normal strength. Normal pulse.       Arms:     Cervical back: Normal range of motion and neck supple. No bony tenderness. No spinous process tenderness.     Thoracic back: No bony tenderness.     Lumbar back: No bony tenderness.     Comments: Clavicles stable. Chest stable to AP/Lat compression. Pelvis stable to Lat compression. No  obvious extremity deformity. No chest or abdominal wall contusion.  Skin:    General: Skin is warm and dry.  Findings: No erythema or rash.  Neurological:     Mental Status: She is alert and oriented to person, place, and time.     Comments: Moving all extremities     ED Results and Treatments Labs (all labs ordered are listed, but only abnormal results are displayed) Labs Reviewed - No data to display                                                                                                                       EKG  EKG Interpretation  Date/Time:    Ventricular Rate:    PR Interval:    QRS Duration:   QT Interval:    QTC Calculation:   R Axis:     Text Interpretation:        Radiology DG [facility] Joints  Result Date: 08/25/2019 CLINICAL DATA:  Right shoulder and arm pain. EXAM: LEFT ACROMIOCLAVICULAR JOINTS COMPARISON:  None. FINDINGS: There is no acute displaced fracture. There is no dislocation. There is no evidence for an [facility] joint separation. IMPRESSION: Negative. Electronically Signed   By: Constance Holster M.D.   On: 08/25/2019 03:55    Pertinent labs & imaging results that were available during my care of the patient were reviewed by me and considered in my medical decision making (see chart for details).  Medications Ordered in ED Medications  acetaminophen (TYLENOL) tablet 1,000 mg (1,000 mg Oral Given 08/25/19 0309)                                                                                                                                    Procedures Procedures  (including critical care time)  Medical Decision Making / ED Course I have reviewed the nursing notes for this encounter and the patient's prior records (if available in EHR or on provided paperwork).   Morgan Lewis was evaluated in Emergency Department on 08/25/2019 for the symptoms described in the history of present illness. She was evaluated in the context of the global COVID-19  pandemic, which necessitated consideration that the patient might be at risk for infection with the SARS-CoV-2 virus that causes COVID-19. Institutional protocols and algorithms that pertain to the evaluation of patients at risk for COVID-19 are in a state of rapid change based on information released by regulatory bodies including the CDC and federal and state organizations. These policies and algorithms were followed during the patient's care in the  ED.  Patient presents in custody of [employer] complaining of right shoulder pain following reported fall downstairs.  Most of the pain is located over the right [facility] joint.  No other bony tenderness to palpation.  No evidence of head trauma.  Low suspicion for ICH.  Plain film of the AC joints without evidence of dislocation concerning for ligamentous injury or fractures.  Likely muscular contusion/strain.  Provided with sling.      Final Clinical Impression(s) / ED Diagnoses Final diagnoses:  None   The patient appears reasonably screened and/or stabilized for discharge and I doubt any other medical condition or other [facility] requiring further screening, evaluation, or treatment in the ED at this time prior to discharge. Safe for discharge with strict return precautions.  Disposition: Discharge  Condition: Good  I have discussed the results, Dx and Tx plan with the patient/family who expressed understanding and agree(s) with the plan. Discharge instructions discussed at length. The patient/family was given strict return precautions who verbalized understanding of the instructions. No further questions at time of discharge.    ED Discharge Orders    None      Follow Up: Primary care provider  Schedule an appointment as soon as possible for a visit  If symptoms do not improve or  worsen      This chart was dictated using voice recognition software.  Despite best efforts to proofread,  errors can occur which can change the documentation meaning.     Fatima Blank, MD 08/25/19 [phone]

## 2019-08-25 NOTE — ED Triage Notes (Signed)
[facility]. She reports that she fell down some stairs yesterday morning at 1030a. Reports R shoulder and arm pain. No LOC. Pt is in custody.

## 2020-04-13 IMAGING — US US MFM OB DETAIL+14 WK
1 series · 12 of 28 positions shown · non-contrast
Comparison: none

[Series 1: us mfm ob detail+14 wk · 61 acquisitions, 12 frames shown]
[im 3/61]
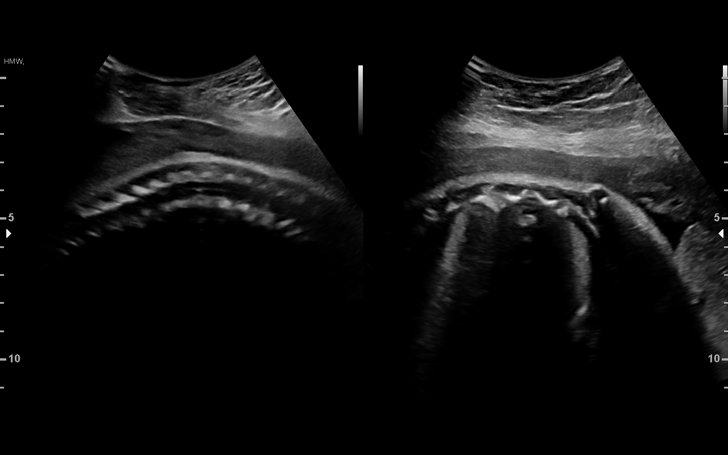
[im 7/61]
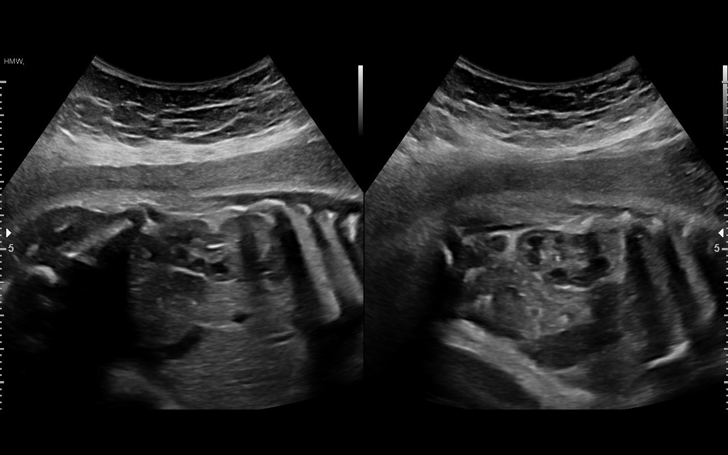
[im 12/61]
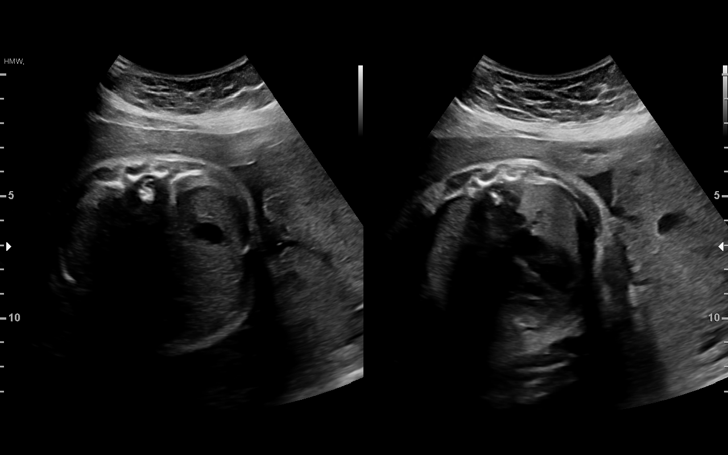
[im 18/61]
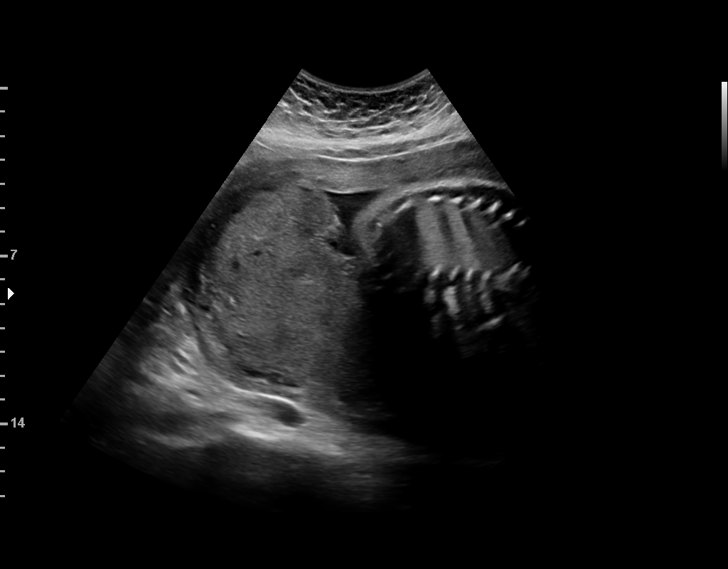
[im 23/61]
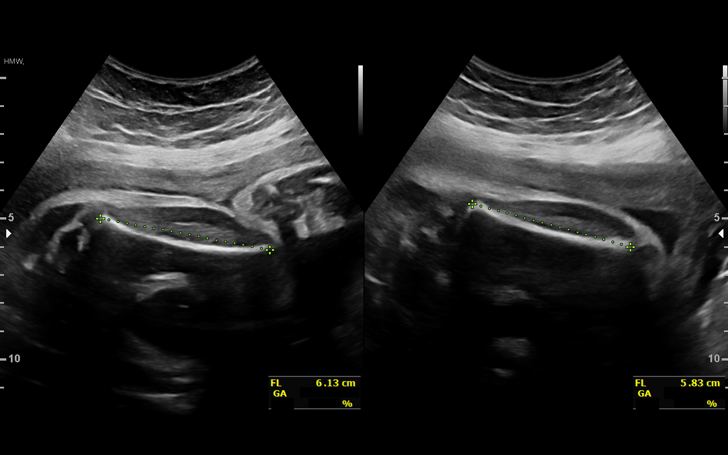
[im 27/61]
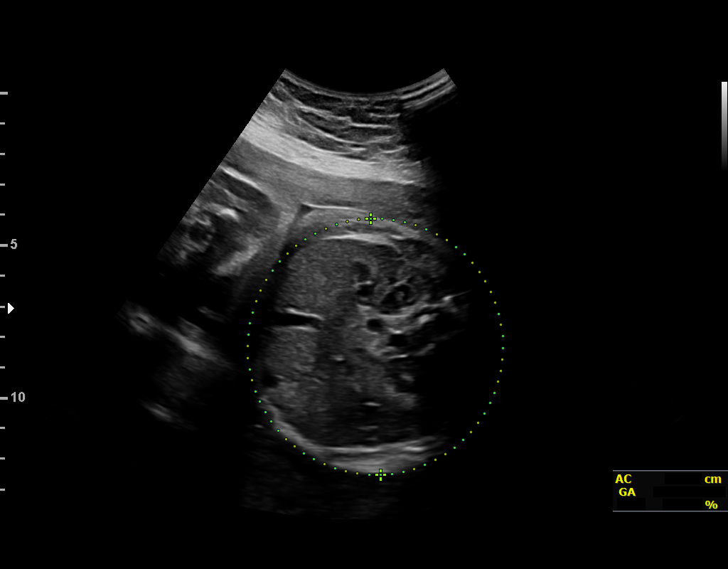
[im 34/61]
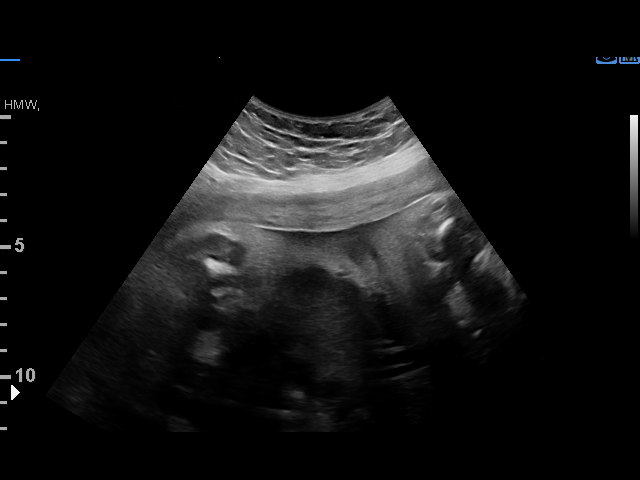
[im 38/61]
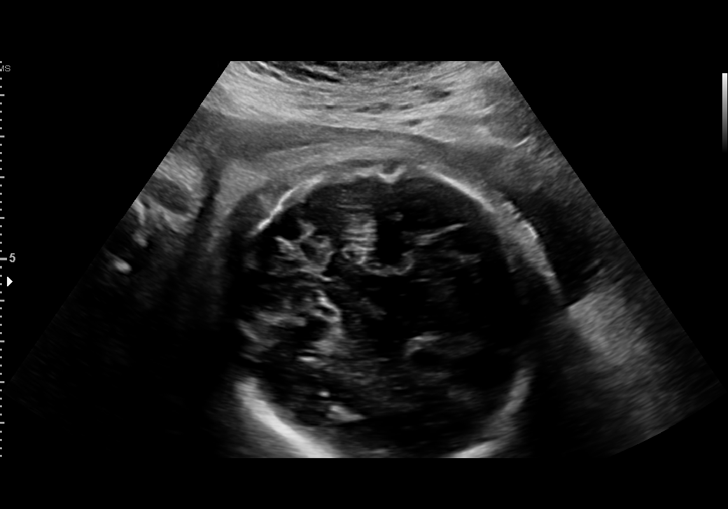
[im 43/61]
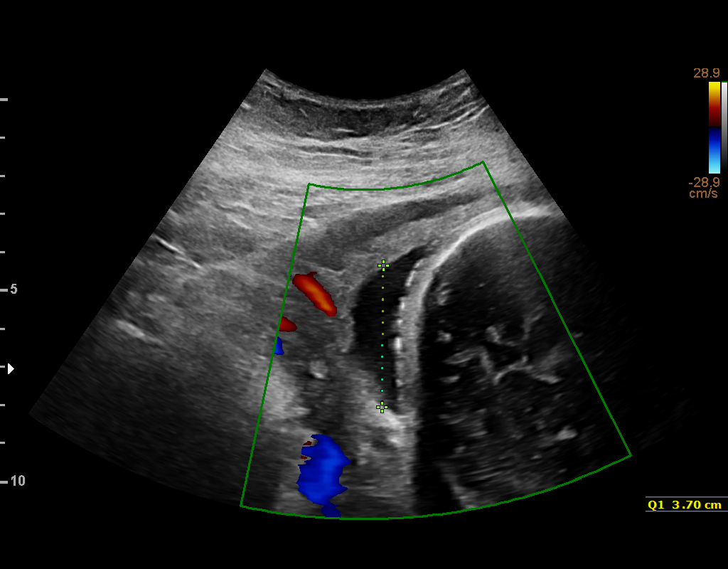
[im 49/61]
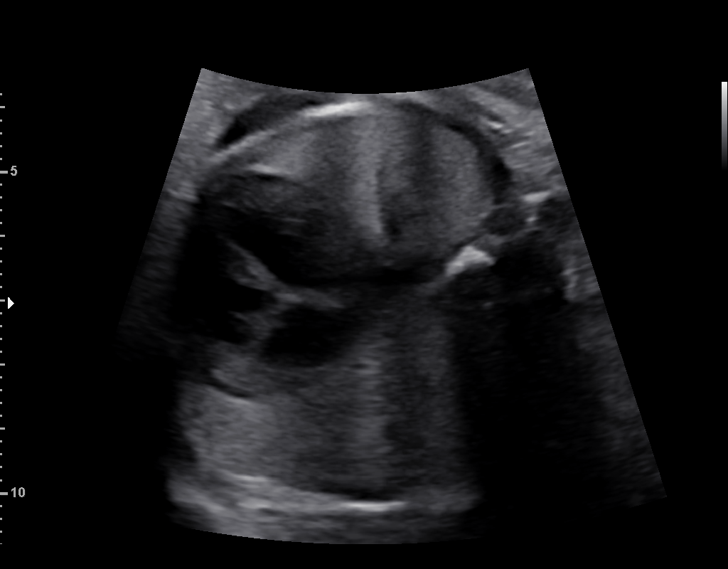
[im 54/61]
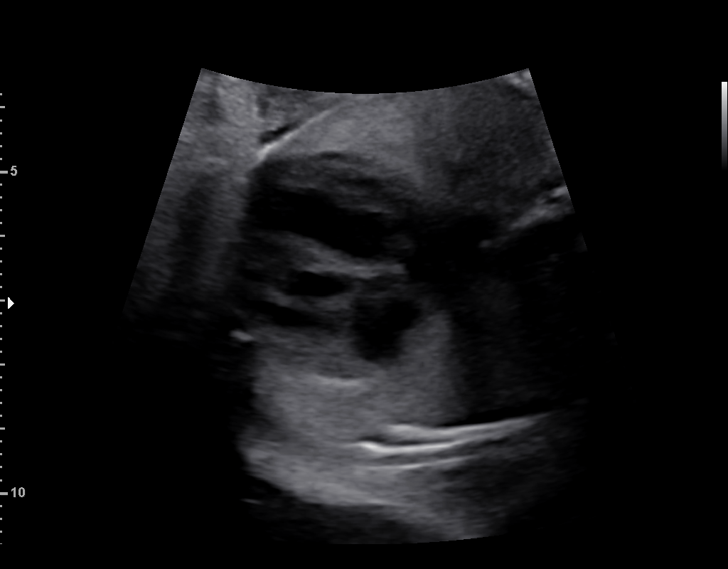
[im 58/61]
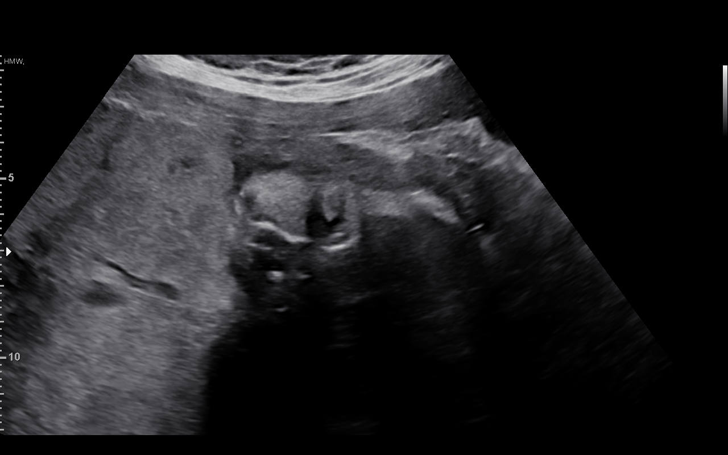

[12 of 28 positions shown; findings below may reference images not displayed]

----------------------------------------------------------------------

 ----------------------------------------------------------------------
Indications

  Obesity complicating pregnancy, third
  trimester (pregravid BMI 32.43)
  Previous cesarean delivery, antepartum x 3
  Encounter for antenatal screening for
  malformations(low risk NIPS)
  Late to prenatal care, third trimester
  30 weeks gestation of pregnancy
 ----------------------------------------------------------------------
Fetal Evaluation

 Num Of Fetuses:         1
 Fetal Heart Rate(bpm):  138
 Cardiac Activity:       Observed
 Presentation:           Cephalic
 Placenta:               Posterior
 P. Cord Insertion:      Not well visualized

 Amniotic Fluid
 AFI FV:      Within normal limits

 AFI Sum(cm)     %Tile       Largest Pocket(cm)
 12.25           33

 RUQ(cm)       RLQ(cm)       LUQ(cm)        LLQ(cm)

Biometry

 BPD:      73.2  mm     G. Age:  29w 3d          6  %    CI:        76.48   %    70 - 86
                                                         FL/HC:      22.5   %    19.3 -
 HC:      265.2  mm     G. Age:  28w 6d        < 1  %    HC/AC:      0.99        0.96 -
 AC:      268.6  mm     G. Age:  31w 0d         49  %    FL/BPD:     81.7   %    71 - 87
 FL:       59.8  mm     G. Age:  31w 1d         44  %    FL/AC:      22.3   %    20 - 24
 HUM:      51.7  mm     G. Age:  30w 1d         39  %
 CER:      40.7  mm     G. Age:  34w 5d       > 95  %
 CM:        6.6  mm

 Est. FW:    9994  gm      3 lb 9 oz     31  %
OB History

 Gravidity:    4         Term:   3        Prem:   0        SAB:   0
 TOP:          0       Ectopic:  0        Living: 3
Gestational Age

 LMP:           30w 6d        Date:  05/21/18                 EDD:   02/25/19
 U/S Today:     30w 1d                                        EDD:   03/02/19
 Best:          30w 6d     Det. By:  LMP  (05/21/18)          EDD:   02/25/19
Anatomy

 Cranium:               Appears normal         Aortic Arch:            Appears normal
 Cavum:                 Appears normal         Ductal Arch:            Not well visualized
 Ventricles:            Not well visualized    Diaphragm:              Appears normal
 Choroid Plexus:        Appears normal         Stomach:                Appears normal, left
                                                                       sided
 Cerebellum:            Appears normal         Abdomen:                Appears normal
 Posterior Fossa:       Appears normal         Abdominal Wall:         Not well visualized
 Nuchal Fold:           Not applicable (>20    Cord Vessels:           Appears normal (3
                        wks GA)                                        vessel cord)
 Face:                  Not well visualized    Kidneys:                Appear normal
 Lips:                  Not well visualized    Bladder:                Appears normal
 Thoracic:              Appears normal         Spine:                  Appears normal
 Heart:                 Not well visualized    Upper Extremities:      Visualized
 RVOT:                  Appears normal         Lower Extremities:      Visualized
 LVOT:                  Not well visualized

 Other:  Technically difficult due to maternal habitus and fetal position.
         Technically difficult due to advanced GA and fetal position.
Cervix Uterus Adnexa

 Cervix
 Not visualized (advanced GA >63wks)

 Uterus
 No abnormality visualized.

 Left Ovary
 No adnexal mass visualized.

 Right Ovary
 No adnexal mass visualized.

 Cul De Sac
 No free fluid seen.

 Adnexa
 No abnormality visualized.
Comments

 This patient was seen for a detailed fetal anatomy scan as
 she presented late for prenatal care and due to maternal
 obesity. She denies any significant past medical history and
 denies any problems in her current pregnancy.
 She had a cell free DNA test earlier in her pregnancy which
 indicated a low risk for trisomy 21, 18, and 13.
 She was informed that the fetal growth and amniotic fluid
 level were appropriate for her gestational age.
 Due to her advanced gestational age, the views of the fetal
 anatomy could not be fully visualized today.
 The patient was informed that anomalies may be missed due
 to technical limitations. If the fetus is in a suboptimal position
 or maternal habitus is increased, visualization of the fetus in
 the maternal uterus may be impaired.
 A follow-up ultrasound was scheduled in 4 weeks to try to
 complete the views of the fetal anatomy.  The patient
 understands that due to her advanced gestational age, it may
 not be possible to visualize all of the fetal anatomy prior to
 delivery.

## 2020-04-22 ENCOUNTER — Encounter (HOSPITAL_COMMUNITY): Payer: Self-pay | Admitting: *Deleted

## 2020-04-22 ENCOUNTER — Emergency Department (HOSPITAL_COMMUNITY)
Admission: EM | Admit: 2020-04-22 | Discharge: 2020-04-22 | Disposition: A | Discharge status: Home / Self Care | Payer: Medicaid Other | Attending: Emergency Medicine | Admitting: Emergency Medicine

## 2020-04-22 ENCOUNTER — Other Ambulatory Visit: Payer: Self-pay

## 2020-04-22 DIAGNOSIS — K760 Fatty (change of) liver, not elsewhere classified: Secondary | ICD-10-CM | POA: Diagnosis not present

## 2020-04-22 DIAGNOSIS — K429 Umbilical hernia without obstruction or gangrene: Secondary | ICD-10-CM | POA: Diagnosis not present

## 2020-04-22 DIAGNOSIS — R1031 Right lower quadrant pain: Secondary | ICD-10-CM | POA: Diagnosis not present

## 2020-04-22 DIAGNOSIS — R112 Nausea with vomiting, unspecified: Secondary | ICD-10-CM | POA: Diagnosis not present

## 2020-04-22 DIAGNOSIS — R109 Unspecified abdominal pain: Secondary | ICD-10-CM | POA: Diagnosis present

## 2020-04-22 DIAGNOSIS — Z5321 Procedure and treatment not carried out due to patient leaving prior to being seen by health care provider: Secondary | ICD-10-CM | POA: Insufficient documentation

## 2020-04-22 DIAGNOSIS — U071 COVID-19: Secondary | ICD-10-CM | POA: Diagnosis not present

## 2020-04-22 LAB — LIPASE, BLOOD: Lipase: 25 U/L (ref 11–51)

## 2020-04-22 LAB — CBC
HCT: 43 % (ref 36.0–46.0)
Hemoglobin: 13.7 g/dL (ref 12.0–15.0)
MCH: 26.3 pg (ref 26.0–34.0)
MCHC: 31.9 g/dL (ref 30.0–36.0)
MCV: 82.7 fL (ref 80.0–100.0)
Platelets: 452 10*3/uL — ABNORMAL HIGH (ref 150–400)
RBC: 5.2 MIL/uL — ABNORMAL HIGH (ref 3.87–5.11)
RDW: 13.9 % (ref 11.5–15.5)
WBC: 8.9 10*3/uL (ref 4.0–10.5)
nRBC: 0 % (ref 0.0–0.2)

## 2020-04-22 LAB — COMPREHENSIVE METABOLIC PANEL
ALT: 20 U/L (ref 0–44)
AST: 16 U/L (ref 15–41)
Albumin: 4.4 g/dL (ref 3.5–5.0)
Alkaline Phosphatase: 95 U/L (ref 38–126)
Anion gap: 10 (ref 5–15)
BUN: <5 mg/dL — ABNORMAL LOW (ref 6–20)
CO2: 25 mmol/L (ref 22–32)
Calcium: 9.9 mg/dL (ref 8.9–10.3)
Chloride: 104 mmol/L (ref 98–111)
Creatinine, Ser: 0.92 mg/dL (ref 0.44–1.00)
GFR, Estimated: >60 mL/min (ref >60)
Glucose, Bld: 111 mg/dL — ABNORMAL HIGH (ref 70–99)
Potassium: 4.1 mmol/L (ref 3.5–5.1)
Sodium: 139 mmol/L (ref 135–145)
Total Bilirubin: 0.6 mg/dL (ref 0.3–1.2)
Total Protein: 7.9 g/dL (ref 6.5–8.1)

## 2020-04-22 LAB — I-STAT BETA HCG BLOOD, ED (MC, WL, AP ONLY): I-stat hCG, quantitative: <5 m[IU]/mL (ref <5)

## 2020-04-22 MED ORDER — ONDANSETRON 4 MG PO TBDP: 4.0000 mg | ORAL_TABLET | Freq: Once | ORAL | Status: DC | PRN

## 2020-04-22 NOTE — ED Notes (Signed)
Pt yelling on her phone that she is going to leave several bad reviews for this hospital. Pt stated she wants to go to another hospital, asking about wait time, yelling to staff that she is not being taken seriously. This NT and Alvino Chapel, RN explained to pt that labs and vitals were part of her care. Security notified.

## 2020-04-22 NOTE — ED Notes (Signed)
I dont see pt in lobby and called X3

## 2020-04-22 NOTE — ED Notes (Signed)
Pt left after talking on the phone and aggravated.

## 2020-04-22 NOTE — ED Notes (Signed)
Asked pt to allow [place] to check VS again

## 2020-04-22 NOTE — ED Triage Notes (Signed)
Pt called EMS due to nausea, vomiting and abdominal pain.  Pt was transported here by ems and pt HR and spo2 was wnl but pt could not tolerate having BP taken.

## 2020-04-22 NOTE — ED Notes (Signed)
Pt refused vital signs.

## 2020-04-22 NOTE — ED Notes (Signed)
Pt is on phone with mother, gave new emesis bag and asked her to allow [place] to get started and get her blood drawn

## 2020-04-23 DIAGNOSIS — K429 Umbilical hernia without obstruction or gangrene: Secondary | ICD-10-CM | POA: Diagnosis not present

## 2020-04-23 DIAGNOSIS — U071 COVID-19: Secondary | ICD-10-CM | POA: Diagnosis not present

## 2020-04-23 DIAGNOSIS — R1031 Right lower quadrant pain: Secondary | ICD-10-CM | POA: Diagnosis not present

## 2020-04-23 DIAGNOSIS — K760 Fatty (change of) liver, not elsewhere classified: Secondary | ICD-10-CM | POA: Diagnosis not present

## 2022-04-20 ENCOUNTER — Telehealth: Payer: Self-pay

## 2022-04-20 NOTE — Telephone Encounter (Signed)
..  Medicaid Managed Care Note  04/20/2022 Name: Morgan Lewis MRN: [id number] DOB: Jul 06, 1995  [name] is a 27 y.o. year old female who is a primary care patient of Patient, No Pcp Per and is actively engaged with the care management team. I reached out to Percival Spanish by phone today to assist with scheduling an initial visit with the BSW  Follow up plan: Unsuccessful telephone outreach attempt made. A HIPAA compliant phone message was left for the patient providing contact information and requesting a return call.  The care management team will reach out to the patient again over the next 3 days.    [place]

## 2022-05-22 DIAGNOSIS — A599 Trichomoniasis, unspecified: Secondary | ICD-10-CM | POA: Diagnosis not present

## 2022-05-22 DIAGNOSIS — Z202 Contact with and (suspected) exposure to infections with a predominantly sexual mode of transmission: Secondary | ICD-10-CM | POA: Diagnosis not present

## 2022-07-01 ENCOUNTER — Other Ambulatory Visit: Payer: Self-pay

## 2022-07-01 ENCOUNTER — Encounter (HOSPITAL_COMMUNITY): Payer: Self-pay

## 2022-07-01 ENCOUNTER — Emergency Department (HOSPITAL_COMMUNITY)
Admission: EM | Admit: 2022-07-01 | Discharge: 2022-07-01 | Disposition: A | Discharge status: Home / Self Care | Payer: Medicaid Other | Attending: Emergency Medicine | Admitting: Emergency Medicine

## 2022-07-01 DIAGNOSIS — R102 Pelvic and perineal pain: Secondary | ICD-10-CM | POA: Insufficient documentation

## 2022-07-01 DIAGNOSIS — Z3202 Encounter for pregnancy test, result negative: Secondary | ICD-10-CM | POA: Insufficient documentation

## 2022-07-01 LAB — URINALYSIS, ROUTINE W REFLEX MICROSCOPIC
Bilirubin Urine: NEGATIVE
Glucose, UA: NEGATIVE mg/dL
Hgb urine dipstick: NEGATIVE
Ketones, ur: NEGATIVE mg/dL
Nitrite: NEGATIVE
Protein, ur: NEGATIVE mg/dL
Specific Gravity, Urine: 1.026 (ref 1.005–1.030)
pH: 6 (ref 5.0–8.0)

## 2022-07-01 LAB — PREGNANCY, URINE: Preg Test, Ur: NEGATIVE

## 2022-07-01 NOTE — Discharge Instructions (Signed)
Your pregnancy test today is negative

## 2022-07-01 NOTE — ED Triage Notes (Signed)
C/o N/v, headache, abd pain with fullness feeling.  Pt reports + preg test at home last night  Pt reports unprotected sex 2 weeks ago while on menstrual.  Denies birth control usage.

## 2022-07-01 NOTE — ED Provider Notes (Signed)
EMERGENCY DEPARTMENT AT [facility] Provider Note   CSN: [id number] Arrival date & time: 07/01/22  1038     History  Chief Complaint  Patient presents with   Possible Pregnancy    Deedee Covin is a 27 y.o. female.  The history is provided by the patient and medical records. No language interpreter was used.  Possible Pregnancy     27 year old female presenting complaining of abdominal discomfort.  Patient report for the past 2 weeks she has had recurrent lower pelvic discomfort.  She also had a positive pregnancy test done recently which concerns her.  She is unsure when was her last menstrual period.  She admits to having unprotected sex but states that she has had tubal ligation in the past and she cannot be pregnant.  She does not endorse any fever or chills no nausea vomiting diarrhea no dysuria no vaginal bleeding or vaginal discharge.  Patient reports she is very stressed at home concerning of the potential pregnancy.  Home Medications Prior to Admission medications   Medication Sig Start Date End Date Taking? Authorizing Provider  Blood Pressure Monitoring KIT 1 kit by Does not apply route once a week. 10/25/18   Lajean Manes, CNM  ibuprofen (ADVIL) 800 MG tablet Take 1 tablet (800 mg total) by mouth every 8 (eight) hours as needed. 02/21/19   Tresea Mall, CNM      Allergies    Patient has no known allergies.    Review of Systems   Review of Systems  All other systems reviewed and are negative.   Physical Exam Updated Vital Signs BP 122/75 (BP Location: Right Arm)   Pulse 93   Temp 98.1 F (36.7 C) (Oral)   Resp 18   Wt 93 kg   SpO2 99%   BMI 33.09 kg/m  Physical Exam Vitals and nursing note reviewed.  Constitutional:      General: She is not in acute distress.    Appearance: She is well-developed.  HENT:     Head: Atraumatic.  Eyes:     Conjunctiva/sclera: Conjunctivae normal.  Cardiovascular:     Rate and  Rhythm: Normal rate and regular rhythm.  Pulmonary:     Effort: Pulmonary effort is normal.  Abdominal:     Palpations: Abdomen is soft.     Tenderness: There is abdominal tenderness (Tenderness to suprapubic region no guarding or rebound tenderness).  Musculoskeletal:     Cervical back: Neck supple.  Skin:    Findings: No rash.  Neurological:     Mental Status: She is alert.  Psychiatric:        Mood and Affect: Mood normal.     ED Results / Procedures / Treatments   Labs (all labs ordered are listed, but only abnormal results are displayed) Labs Reviewed  URINALYSIS, ROUTINE W REFLEX MICROSCOPIC - Abnormal; Notable for the following components:      Result Value   APPearance HAZY (*)    Leukocytes,Ua TRACE (*)    Bacteria, UA RARE (*)    All other components within normal limits  PREGNANCY, URINE    EKG None  Radiology No results found.  Procedures Procedures    Medications Ordered in ED Medications - No data to display  ED Course/ Medical Decision Making/ A&P                             Medical Decision Making Amount  and/or Complexity of Data Reviewed Labs: ordered.   BP 122/75 (BP Location: Right Arm)   Pulse 93   Temp 98.1 F (36.7 C) (Oral)   Resp 18   Wt 93 kg   SpO2 99%   BMI 33.09 kg/m   76:66 AM 27 year old female presenting complaining of abdominal discomfort.  Patient report for the past 2 weeks she has had recurrent lower pelvic discomfort.  She also had a positive pregnancy test done recently which concerns her.  She is unsure when was her last menstrual period.  She admits to having unprotected sex but states that she has had tubal ligation in the past and she cannot be pregnant.  She does not endorse any fever or chills no nausea vomiting diarrhea no dysuria no vaginal bleeding or vaginal discharge.  Patient reports she is very stressed at home concerning of the potential pregnancy.  On exam, patient is sitting in the chair appears to  be in no acute discomfort.  Heart with normal rate and rhythm, lungs clear to auscultation bilaterally abdomen is soft with tenderness to suprapubic region but no guarding or rebound tenderness.  Vital signs reviewed and overall reassuring.  Urinalysis obtained without signs of urinary tract infection, her pregnancy test here is negative.  Discussed finding with patient.  She feels comfortable going home.  Return precaution given.        Final Clinical Impression(s) / ED Diagnoses Final diagnoses:  Negative pregnancy test    Rx / DC Orders ED Discharge Orders     None         Domenic Moras, PA-C 07/01/22 1218    Lennice Sites, DO 07/01/22 1418

## 2023-01-10 DIAGNOSIS — H5213 Myopia, bilateral: Secondary | ICD-10-CM | POA: Diagnosis not present

## 2023-10-10 ENCOUNTER — Encounter (HOSPITAL_BASED_OUTPATIENT_CLINIC_OR_DEPARTMENT_OTHER): Payer: Self-pay | Admitting: Emergency Medicine

## 2023-10-10 ENCOUNTER — Emergency Department (HOSPITAL_BASED_OUTPATIENT_CLINIC_OR_DEPARTMENT_OTHER)
Admission: EM | Admit: 2023-10-10 | Discharge: 2023-10-11 | Disposition: A | Discharge status: Home / Self Care | Attending: Emergency Medicine | Admitting: Emergency Medicine

## 2023-10-10 ENCOUNTER — Other Ambulatory Visit: Payer: Self-pay

## 2023-10-10 ENCOUNTER — Emergency Department (HOSPITAL_BASED_OUTPATIENT_CLINIC_OR_DEPARTMENT_OTHER)

## 2023-10-10 DIAGNOSIS — R000 Tachycardia, unspecified: Secondary | ICD-10-CM | POA: Insufficient documentation

## 2023-10-10 DIAGNOSIS — S41021A Laceration with foreign body of right shoulder, initial encounter: Secondary | ICD-10-CM | POA: Diagnosis not present

## 2023-10-10 DIAGNOSIS — S41011A Laceration without foreign body of right shoulder, initial encounter: Secondary | ICD-10-CM | POA: Insufficient documentation

## 2023-10-10 DIAGNOSIS — X58XXXA Exposure to other specified factors, initial encounter: Secondary | ICD-10-CM | POA: Diagnosis not present

## 2023-10-10 DIAGNOSIS — N3000 Acute cystitis without hematuria: Secondary | ICD-10-CM | POA: Insufficient documentation

## 2023-10-10 DIAGNOSIS — N3001 Acute cystitis with hematuria: Secondary | ICD-10-CM | POA: Diagnosis not present

## 2023-10-10 DIAGNOSIS — Z87891 Personal history of nicotine dependence: Secondary | ICD-10-CM | POA: Diagnosis not present

## 2023-10-10 DIAGNOSIS — Z79899 Other long term (current) drug therapy: Secondary | ICD-10-CM | POA: Diagnosis not present

## 2023-10-10 DIAGNOSIS — S81819A Laceration without foreign body, unspecified lower leg, initial encounter: Secondary | ICD-10-CM

## 2023-10-10 DIAGNOSIS — Z23 Encounter for immunization: Secondary | ICD-10-CM | POA: Insufficient documentation

## 2023-10-10 DIAGNOSIS — S4991XA Unspecified injury of right shoulder and upper arm, initial encounter: Secondary | ICD-10-CM | POA: Diagnosis present

## 2023-10-10 DIAGNOSIS — S41121A Laceration with foreign body of right upper arm, initial encounter: Secondary | ICD-10-CM | POA: Diagnosis not present

## 2023-10-10 DIAGNOSIS — S81811A Laceration without foreign body, right lower leg, initial encounter: Secondary | ICD-10-CM | POA: Diagnosis not present

## 2023-10-10 LAB — URINALYSIS, ROUTINE W REFLEX MICROSCOPIC
Bilirubin Urine: NEGATIVE
Glucose, UA: NEGATIVE mg/dL
Ketones, ur: NEGATIVE mg/dL
Nitrite: POSITIVE — AB
Protein, ur: 30 mg/dL — AB
Specific Gravity, Urine: 1.01 (ref 1.005–1.030)
pH: 7 (ref 5.0–8.0)

## 2023-10-10 LAB — URINE DRUG SCREEN
Amphetamines: NOT DETECTED
Barbiturates: NOT DETECTED
Benzodiazepines: NOT DETECTED
Cocaine: NOT DETECTED
Fentanyl: NOT DETECTED
Methadone Scn, Ur: NOT DETECTED
Opiates: NOT DETECTED
Tetrahydrocannabinol: DETECTED — AB

## 2023-10-10 LAB — CBC WITH DIFFERENTIAL/PLATELET
Abs Immature Granulocytes: 0.07 K/uL (ref 0.00–0.07)
Basophils Absolute: 0 K/uL (ref 0.0–0.1)
Basophils Relative: 0 %
Eosinophils Absolute: 0.1 K/uL (ref 0.0–0.5)
Eosinophils Relative: 0 %
HCT: 31.8 % — ABNORMAL LOW (ref 36.0–46.0)
Hemoglobin: 10.7 g/dL — ABNORMAL LOW (ref 12.0–15.0)
Immature Granulocytes: 1 %
Lymphocytes Relative: 9 %
Lymphs Abs: 1.3 K/uL (ref 0.7–4.0)
MCH: 25.7 pg — ABNORMAL LOW (ref 26.0–34.0)
MCHC: 33.6 g/dL (ref 30.0–36.0)
MCV: 76.4 fL — ABNORMAL LOW (ref 80.0–100.0)
Monocytes Absolute: 0.8 K/uL (ref 0.1–1.0)
Monocytes Relative: 5 %
Neutro Abs: 12.9 K/uL — ABNORMAL HIGH (ref 1.7–7.7)
Neutrophils Relative %: 85 %
Platelets: 394 K/uL (ref 150–400)
RBC: 4.16 MIL/uL (ref 3.87–5.11)
RDW: 17.2 % — ABNORMAL HIGH (ref 11.5–15.5)
WBC: 15.2 K/uL — ABNORMAL HIGH (ref 4.0–10.5)
nRBC: 0 % (ref 0.0–0.2)

## 2023-10-10 LAB — BASIC METABOLIC PANEL WITH GFR
Anion gap: 15 (ref 5–15)
BUN: 10 mg/dL (ref 6–20)
CO2: 22 mmol/L (ref 22–32)
Calcium: 9.5 mg/dL (ref 8.9–10.3)
Chloride: 101 mmol/L (ref 98–111)
Creatinine, Ser: 1.09 mg/dL — ABNORMAL HIGH (ref 0.44–1.00)
GFR, Estimated: >60 mL/min (ref >60)
Glucose, Bld: 103 mg/dL — ABNORMAL HIGH (ref 70–99)
Potassium: 3.5 mmol/L (ref 3.5–5.1)
Sodium: 138 mmol/L (ref 135–145)

## 2023-10-10 LAB — HCG, SERUM, QUALITATIVE: Preg, Serum: NEGATIVE

## 2023-10-10 LAB — PROTIME-INR
INR: 1.1 (ref 0.8–1.2)
Prothrombin Time: 14.3 s (ref 11.4–15.2)

## 2023-10-10 LAB — URINALYSIS, MICROSCOPIC (REFLEX): WBC, UA: >50 WBC/hpf (ref 0–5)

## 2023-10-10 LAB — ETHANOL: Alcohol, Ethyl (B): <15 mg/dL (ref <15)

## 2023-10-10 MED ORDER — CEFAZOLIN SODIUM-DEXTROSE 1-4 GM/50ML-% IV SOLN
1.0000 g | Freq: Once | INTRAVENOUS | Status: AC
  Administered 2023-10-10: 1 g via INTRAVENOUS
  Filled 2023-10-10: qty 50

## 2023-10-10 MED ORDER — ONDANSETRON HCL 4 MG/2ML IJ SOLN
4.0000 mg | Freq: Once | INTRAMUSCULAR | Status: AC
  Administered 2023-10-10: 4 mg via INTRAVENOUS
  Filled 2023-10-10: qty 2

## 2023-10-10 MED ORDER — LIDOCAINE-EPINEPHRINE (PF) 2 %-1:200000 IJ SOLN
20.0000 mL | Freq: Once | INTRAMUSCULAR | Status: AC
  Administered 2023-10-10: 20 mL

## 2023-10-10 MED ORDER — SODIUM CHLORIDE 0.9 % IV BOLUS
1000.0000 mL | Freq: Once | INTRAVENOUS | Status: AC
  Administered 2023-10-10: 1000 mL via INTRAVENOUS

## 2023-10-10 MED ORDER — LORAZEPAM 2 MG/ML IJ SOLN
1.0000 mg | Freq: Once | INTRAMUSCULAR | Status: AC
  Administered 2023-10-10: 1 mg via INTRAVENOUS

## 2023-10-10 MED ORDER — LORAZEPAM 2 MG/ML IJ SOLN
1.0000 mg | Freq: Once | INTRAMUSCULAR | Status: AC
  Administered 2023-10-10: 1 mg via INTRAVENOUS
  Filled 2023-10-10: qty 1

## 2023-10-10 MED ORDER — TRAMADOL HCL 50 MG PO TABS: 50.0000 mg | ORAL_TABLET | Freq: Four times a day (QID) | ORAL | 0 refills | Status: AC | PRN

## 2023-10-10 MED ORDER — LIDOCAINE-EPINEPHRINE (PF) 2 %-1:200000 IJ SOLN
INTRAMUSCULAR | Status: AC
  Filled 2023-10-10: qty 20

## 2023-10-10 MED ORDER — TETANUS-DIPHTH-ACELL PERTUSSIS 5-2.5-18.5 LF-MCG/0.5 IM SUSY
0.5000 mL | PREFILLED_SYRINGE | Freq: Once | INTRAMUSCULAR | Status: AC
  Administered 2023-10-11: 0.5 mL via INTRAMUSCULAR
  Filled 2023-10-10: qty 0.5

## 2023-10-10 MED ORDER — CEPHALEXIN 500 MG PO CAPS: 500.0000 mg | ORAL_CAPSULE | Freq: Four times a day (QID) | ORAL | 0 refills | Status: DC

## 2023-10-10 MED ORDER — LIDOCAINE HCL (PF) 1 % IJ SOLN
INTRAMUSCULAR | Status: AC
  Administered 2023-10-10: 5 mL
  Filled 2023-10-10: qty 5

## 2023-10-10 MED ORDER — HYDROMORPHONE HCL 1 MG/ML IJ SOLN
1.0000 mg | Freq: Once | INTRAMUSCULAR | Status: AC
  Administered 2023-10-10: 1 mg via INTRAVENOUS
  Filled 2023-10-10: qty 1

## 2023-10-10 NOTE — Discharge Instructions (Addendum)
 Take the antibiotic as prescribed. This will cover both your urinary tract infection and the deep laceration to your right arm. Tramadol  for pain.   Please follow up with Dr. Edna by calling his office in the morning and scheduling an appointment in office. This is important to ensure this heals without complications.

## 2023-10-10 NOTE — ED Notes (Signed)
 Pt given 2 mg Ativan  during procedure. Dr. Armenta gave verbal orders for Ativan  at the bedside, starting with 1 mg and then an additional 1 mg. See MAR, 1 mg given at 2027, additional 1 mg given at 2037. None to waste.   Aleck FORBES Molt, RN

## 2023-10-10 NOTE — ED Notes (Signed)
 ED Provider at bedside.

## 2023-10-10 NOTE — ED Provider Notes (Incomplete)
 I provided a substantive portion of the care of this patient.  I personally made/approved the management plan for this patient and take responsibility for the patient management. {Remember to document shared critical care using "edcritical" dot phrase:1}

## 2023-10-10 NOTE — ED Provider Notes (Signed)
 [place] [facility] Provider Note   CSN: [id number] Arrival date & time: 10/10/23  1925     Patient presents with: Laceration   Morgan Lewis is a 28 y.o. female.  {Add pertinent medical, surgical, social history, OB history to YEP:67052} Patient to ED with right arm laceration. Per EMS report, ?arterial. The patient provides limited history, is emotionally upset, tearful. She reports she became upset after a conversation between another woman and her boyfriend, and she put her arm through a glass door. Unknown last tetanus, per patient.   The history is provided by the patient and the EMS personnel. No language interpreter was used.  Laceration      Prior to Admission medications   Medication Sig Start Date End Date Taking? Authorizing Provider  Blood Pressure Monitoring KIT 1 kit by Does not apply route once a week. 10/25/18   Rogers, Veronica C, CNM  ibuprofen  (ADVIL ) 800 MG tablet Take 1 tablet (800 mg total) by mouth every 8 (eight) hours as needed. 02/21/19   Edmundo Powell BIRCH, CNM    Allergies: Patient has no known allergies.    Review of Systems  Updated Vital Signs There were no vitals taken for this visit.  Physical Exam Vitals and nursing note reviewed.        (all labs ordered are listed, but only abnormal results are displayed) Labs Reviewed  CBC WITH DIFFERENTIAL/PLATELET  BASIC METABOLIC PANEL WITH GFR  HCG, SERUM, QUALITATIVE  URINALYSIS, ROUTINE W REFLEX MICROSCOPIC  URINE DRUG SCREEN  ETHANOL  PROTIME-INR   Results for orders placed or performed during the hospital encounter of 10/10/23  CBC with Differential   Collection Time: 10/10/23  7:50 PM  Result Value Ref Range   WBC 15.2 (H) 4.0 - 10.5 K/uL   RBC 4.16 3.87 - 5.11 MIL/uL   Hemoglobin 10.7 (L) 12.0 - 15.0 g/dL   HCT 68.1 (L) 63.9 - 53.9 %   MCV 76.4 (L) 80.0 - 100.0 fL   MCH 25.7 (L) 26.0 - 34.0 pg   MCHC 33.6 30.0 - 36.0 g/dL   RDW 82.7  (H) 88.4 - 15.5 %   Platelets 394 150 - 400 K/uL   nRBC 0.0 0.0 - 0.2 %   Neutrophils Relative % 85 %   Neutro Abs 12.9 (H) 1.7 - 7.7 K/uL   Lymphocytes Relative 9 %   Lymphs Abs 1.3 0.7 - 4.0 K/uL   Monocytes Relative 5 %   Monocytes Absolute 0.8 0.1 - 1.0 K/uL   Eosinophils Relative 0 %   Eosinophils Absolute 0.1 0.0 - 0.5 K/uL   Basophils Relative 0 %   Basophils Absolute 0.0 0.0 - 0.1 K/uL   Immature Granulocytes 1 %   Abs Immature Granulocytes 0.07 0.00 - 0.07 K/uL  Basic metabolic panel   Collection Time: 10/10/23  7:50 PM  Result Value Ref Range   Sodium 138 135 - 145 mmol/L   Potassium 3.5 3.5 - 5.1 mmol/L   Chloride 101 98 - 111 mmol/L   CO2 22 22 - 32 mmol/L   Glucose, Bld 103 (H) 70 - 99 mg/dL   BUN 10 6 - 20 mg/dL   Creatinine, Ser 8.90 (H) 0.44 - 1.00 mg/dL   Calcium 9.5 8.9 - 89.6 mg/dL   GFR, Estimated >39 >39 mL/min   Anion gap 15 5 - 15  Pregnancy serum, qualitative   Collection Time: 10/10/23  7:50 PM  Result Value Ref Range   Preg,  Serum NEGATIVE NEGATIVE  Ethanol   Collection Time: 10/10/23  7:50 PM  Result Value Ref Range   Alcohol , Ethyl (B) <15 <15 mg/dL  Protime-INR   Collection Time: 10/10/23  7:50 PM  Result Value Ref Range   Prothrombin Time 14.3 11.4 - 15.2 seconds   INR 1.1 0.8 - 1.2     EKG: None  Radiology: No results found.  {Document cardiac monitor, telemetry assessment procedure when appropriate:32947} Procedures   Medications Ordered in the ED  LORazepam  (ATIVAN ) injection 1 mg (has no administration in time range)      {Click here for ABCD2, HEART and other calculators REFRESH Note before signing:1}                              Medical Decision Making This patient presents to the ED for concern of upper arm laceration, this involves an extensive number of treatment options, and is a complaint that carries with it a high risk of complications and morbidity.  The differential diagnosis includes arterial bleed, foreign  body, muscle rupture,    Co morbidities that complicate the patient evaluation  ADHD, anxiety   Additional history obtained:  Additional history and/or information obtained fromchart review, notable for boyfriend at bedside - adds no significant history   Lab Tests:  I Ordered, and personally interpreted labs.  The pertinent results include:   CBC: WBC 15.2, hgb 10.7 (last comparable 2022) Cmet: no electrolyte imbalance, normal renal function   Imaging Studies ordered:  I ordered imaging studies including right humerus/ I independently visualized and interpreted imaging which showed *** I agree with the radiologist interpretation   Cardiac Monitoring:  The patient was maintained on a cardiac monitor.  I personally viewed and interpreted the cardiac monitored which showed an underlying rhythm of: ***   Medicines ordered and prescription drug management:  I ordered medication including ***  for *** Reevaluation of the patient after these medicines showed that the patient {resolved/improved/worsened:23923::improved} I have reviewed the patients home medicines and have made adjustments as needed   Test Considered:  ***   Critical Interventions:  ***   Consultations Obtained:  I requested consultation with the ***,  and discussed lab and imaging findings as well as pertinent plan - they recommend: ***   Problem List / ED Course:  ***   Reevaluation:  After the interventions noted above, I reevaluated the patient and found that they have :{resolved/improved/worsened:23923::improved}   Social Determinants of Health:  ***   Disposition:  After consideration of the diagnostic results and the patients response to treatment, I feel that the patient would benefit from ***.   Amount and/or Complexity of Data Reviewed Labs: ordered. Radiology: ordered.  Risk Prescription drug management.   ***  {Document critical care time when appropriate   Document review of labs and clinical decision tools ie CHADS2VASC2, etc  Document your independent review of radiology images and any outside records  Document your discussion with family members, caretakers and with consultants  Document social determinants of health affecting pt's care  Document your decision making why or why not admission, treatments were needed:32947:::1}   Final diagnoses:  None    ED Discharge Orders     None

## 2023-10-10 NOTE — ED Provider Notes (Signed)
 I provided a substantive portion of the care of this patient.  I personally made/approved the management plan for this patient and take responsibility for the patient management. {Remember to document shared critical care using edcritical dot phrase:1}

## 2023-10-10 NOTE — ED Triage Notes (Signed)
 Pt presents with a wrapped wound on her R arm. Bandage applied over elbow area by EMS per pt and visitor. No bleed through at this time. Pt distraught, and difficult to redirect. Initially pt refused to come inside the ED over fears the man with her would leave. Eventually pt was helped into a tx room and provider notified. Pt showed this RN a photo on her phone of the wound that appears large and deep. Pt stated she was upset and punched through a glass door. No intent for self harm or SI, just got upset in the moment.

## 2023-10-11 MED ORDER — ONDANSETRON 4 MG PO TBDP
4.0000 mg | ORAL_TABLET | Freq: Once | ORAL | Status: AC
  Administered 2023-10-11: 4 mg via ORAL
  Filled 2023-10-11: qty 1

## 2023-10-11 NOTE — ED Notes (Signed)
 Pt was picked up by friend at time of discharge. Assisted to her female friends car. Patient alert and oriented x 4. Patient no longer escorted by female friend that brought her in initially. Female friend left the premises a couple hours earlier.

## 2023-10-11 NOTE — ED Notes (Signed)
 Pt nauseated and vomiting while providing a urine sample. Provider notified. Orders given.

## 2023-10-11 NOTE — ED Notes (Signed)
 Bandage removed by EDP. Pt able to grip with R hand. No active bleeding at this time. Deep laceration to medial aspect of R upper arm.

## 2023-10-11 NOTE — ED Notes (Signed)
 Pt calming down. Denies SI or thoughts of harming herself. States she is just heartbroken and upset. She states this happened when she was upset and hit a glass door with her hand, breaking the glass.

## 2023-10-11 NOTE — ED Notes (Signed)
 Applied dry dsg to rt laceration. Non adherent with kerlix. Patient tolerated well.

## 2023-10-25 ENCOUNTER — Encounter (HOSPITAL_BASED_OUTPATIENT_CLINIC_OR_DEPARTMENT_OTHER): Payer: Self-pay | Admitting: Urology

## 2023-10-25 ENCOUNTER — Other Ambulatory Visit: Payer: Self-pay

## 2023-10-25 ENCOUNTER — Emergency Department (HOSPITAL_BASED_OUTPATIENT_CLINIC_OR_DEPARTMENT_OTHER)
Admission: EM | Admit: 2023-10-25 | Discharge: 2023-10-25 | Disposition: A | Discharge status: Home / Self Care | Attending: Emergency Medicine | Admitting: Emergency Medicine

## 2023-10-25 DIAGNOSIS — Z4800 Encounter for change or removal of nonsurgical wound dressing: Secondary | ICD-10-CM | POA: Diagnosis present

## 2023-10-25 DIAGNOSIS — Z5189 Encounter for other specified aftercare: Secondary | ICD-10-CM

## 2023-10-25 DIAGNOSIS — Z4801 Encounter for change or removal of surgical wound dressing: Secondary | ICD-10-CM | POA: Diagnosis not present

## 2023-10-25 NOTE — ED Triage Notes (Signed)
 Right upper arm wound with sutures from 7/6  Unknown when sutures are supposed to come out  Slight redness noted but well approximated

## 2023-10-25 NOTE — Discharge Instructions (Addendum)
 Patient today revealed that your laceration repair is overall healing nicely.  Please discontinue the peroxide and continue to use soap and water daily for cleaning.  Please have the sutures removed by the end of the week as skin will start to grow over the sutures making them more difficult to remove it if you wait too long.  If there is worsening redness, swelling, pustular oozing or any other concerning symptom please return to the ED for further evaluation.

## 2023-10-25 NOTE — ED Provider Notes (Signed)
 [place] [facility] Provider Note   CSN: [id number] Arrival date & time: 10/25/23  1301     Patient presents with: Wound Check  HPI Morgan Lewis is a 28 y.o. female s/p deep laceration to the right upper arm with suture repair on 7/6 presenting for reevaluation.  She states she has been cleaning with soap and water and peroxide.  Denies any oozing or bleeding or worsening swelling or redness.  Notes some tenderness at the site.  States she does not want to have them removed today but wants someone to take a look.    Wound Check       Prior to Admission medications   Medication Sig Start Date End Date Taking? Authorizing Provider  Blood Pressure Monitoring KIT 1 kit by Does not apply route once a week. 10/25/18   Rogers, Veronica C, CNM  cephALEXin  (KEFLEX ) 500 MG capsule Take 1 capsule (500 mg total) by mouth 4 (four) times daily. 10/10/23   Odell Balls, PA-C  ibuprofen  (ADVIL ) 800 MG tablet Take 1 tablet (800 mg total) by mouth every 8 (eight) hours as needed. 02/21/19   Edmundo Powell BIRCH, CNM  traMADol  (ULTRAM ) 50 MG tablet Take 1 tablet (50 mg total) by mouth every 6 (six) hours as needed. 10/10/23   Odell Balls, PA-C    Allergies: Patient has no known allergies.    Review of Systems See HPI  Updated Vital Signs BP 104/75 (BP Location: Left Arm)   Pulse 69   Temp 97.9 F (36.6 C) (Oral)   Resp 16   Ht 5' 6 (1.676 m)   Wt 66.4 kg   SpO2 97%   BMI 23.63 kg/m   Physical Exam Constitutional:      Appearance: Normal appearance.  HENT:     Head: Normocephalic.     Nose: Nose normal.  Eyes:     Conjunctiva/sclera: Conjunctivae normal.  Pulmonary:     Effort: Pulmonary effort is normal.  Skin:     Neurological:     Mental Status: She is alert.  Psychiatric:        Mood and Affect: Mood normal.     (all labs ordered are listed, but only abnormal results are displayed) Labs Reviewed - No data to  display  EKG: None  Radiology: No results found.   Procedures   Medications Ordered in the ED - No data to display                                  Medical Decision Making  28 year old well-appearing female presenting for evaluation of a laceration to her right upper arm that was repaired over 2 weeks ago.  [facility].  Overall looks like it is healing well there was some mild erythema noted at the radial aspect of the laceration but does not appear to be infected.  I did advise her that it is time to remove the sutures but she refused suture removal today and stated she would rather wait till after work tomorrow.  Advised her to please have the sutures removed before the end of the week.  Also advised to discontinue the peroxide and use soap and water daily for cleaning.  Discussed return precautions.  Discharged good condition.     Final diagnoses:  Visit for wound check    ED Discharge Orders     None  Lang Norleen POUR, PA-C 10/25/23 1353    Emil Share, DO 10/26/23 [phone]

## 2024-01-15 ENCOUNTER — Emergency Department (HOSPITAL_BASED_OUTPATIENT_CLINIC_OR_DEPARTMENT_OTHER)
Admission: EM | Admit: 2024-01-15 | Discharge: 2024-01-16 | Disposition: A | Attending: Emergency Medicine | Admitting: Emergency Medicine

## 2024-01-15 ENCOUNTER — Encounter (HOSPITAL_BASED_OUTPATIENT_CLINIC_OR_DEPARTMENT_OTHER): Payer: Self-pay | Admitting: Urology

## 2024-01-15 ENCOUNTER — Other Ambulatory Visit: Payer: Self-pay

## 2024-01-15 DIAGNOSIS — Z4801 Encounter for change or removal of surgical wound dressing: Secondary | ICD-10-CM | POA: Diagnosis not present

## 2024-01-15 DIAGNOSIS — L089 Local infection of the skin and subcutaneous tissue, unspecified: Secondary | ICD-10-CM | POA: Insufficient documentation

## 2024-01-15 DIAGNOSIS — Z4802 Encounter for removal of sutures: Secondary | ICD-10-CM | POA: Diagnosis present

## 2024-01-15 MED ORDER — LIDOCAINE-EPINEPHRINE-TETRACAINE (LET) TOPICAL GEL
3.0000 mL | Freq: Once | TOPICAL | Status: AC
  Administered 2024-01-16: 3 mL via TOPICAL
  Filled 2024-01-15: qty 3

## 2024-01-15 NOTE — ED Triage Notes (Signed)
 Pt states got stitches  4 months ago in right arm  Never had them removed  Now red and painful

## 2024-01-15 NOTE — ED Provider Notes (Addendum)
 [place] [facility] Provider Note   CSN: [id number] Arrival date & time: 01/15/24  2331     History Chief Complaint  Patient presents with   Suture / Staple Removal    HPI Morgan Lewis is a 28 y.o. female presenting for redness and pain at her prior suture site.  She had sutures placed in July but states that she could not bring herself to come and have them taken out because she was worried about the pain of the procedure.  However over the past 48 hours the site became red and inflamed and started having discharge from 2 of the most superior sutures.  Denies systemic fevers chills nausea vomiting syncope shortness of breath..   Patient's recorded medical, surgical, social, medication list and allergies were reviewed in the Snapshot window as part of the initial history.   Review of Systems   Review of Systems  Constitutional:  Negative for chills and fever.  HENT:  Negative for ear pain and sore throat.   Eyes:  Negative for pain and visual disturbance.  Respiratory:  Negative for cough and shortness of breath.   Cardiovascular:  Negative for chest pain and palpitations.  Gastrointestinal:  Negative for abdominal pain and vomiting.  Genitourinary:  Negative for dysuria and hematuria.  Musculoskeletal:  Negative for arthralgias and back pain.  Skin:  Positive for rash. Negative for color change.  Neurological:  Negative for seizures and syncope.  All other systems reviewed and are negative.   Physical Exam Updated Vital Signs BP 101/71 (BP Location: Left Arm)   Pulse 92   Temp (!) 97.2 F (36.2 C)   Resp 18   Ht 5' 6 (1.676 m)   Wt 66.4 kg   SpO2 95%   BMI 23.63 kg/m  Physical Exam Vitals and nursing note reviewed.  Constitutional:      General: She is not in acute distress.    Appearance: She is well-developed.  HENT:     Head: Normocephalic and atraumatic.  Eyes:     Conjunctiva/sclera: Conjunctivae normal.   Cardiovascular:     Rate and Rhythm: Normal rate and regular rhythm.     Heart sounds: No murmur heard. Pulmonary:     Effort: Pulmonary effort is normal. No respiratory distress.     Breath sounds: Normal breath sounds.  Abdominal:     General: There is no distension.     Palpations: Abdomen is soft.     Tenderness: There is no abdominal tenderness. There is no right CVA tenderness or left CVA tenderness.  Musculoskeletal:        General: No swelling or tenderness. Normal range of motion.     Cervical back: Neck supple.  Skin:    General: Skin is warm and dry.     Findings: Erythema and rash present.  Neurological:     General: No focal deficit present.     Mental Status: She is alert and oriented to person, place, and time. Mental status is at baseline.     Cranial Nerves: No cranial nerve deficit.      ED Course/ Medical Decision Making/ A&P    Procedures Suture Removal  Date/Time: 01/16/2024 4:10 AM  Performed by: Jerral Meth, MD Authorized by: Jerral Meth, MD   Consent:    Consent obtained:  Verbal   Consent given by:  Patient   Risks, benefits, and alternatives were discussed: yes     Risks discussed:  Bleeding, pain and wound  separation   Alternatives discussed:  Delayed treatment Post-procedure details:    Procedure completion:  Tolerated well, no immediate complications    Medications Ordered in ED Medications  cephALEXin  (KEFLEX ) capsule 500 mg (500 mg Oral Not Given 01/16/24 0248)  lidocaine -EPINEPHrine -tetracaine (LET) topical gel (3 mLs Topical Given 01/16/24 0009)  acetaminophen  (TYLENOL ) tablet 1,000 mg (1,000 mg Oral Given 01/16/24 0009)  oxyCODONE  (Oxy IR/ROXICODONE ) immediate release tablet 10 mg (10 mg Oral Given 01/16/24 0122)  lidocaine -EPINEPHrine  (XYLOCAINE  W/EPI) 2 %-1:200000 (PF) injection 10 mL (10 mLs Intradermal Given by Other 01/16/24 0130)    Medical Decision Making:   28 year old female presenting for suture removal.   Unfortunately she is months delayed on this presentation.  She denies fevers chills nausea vomiting syncope shortness of breath or any evidence of systemic infection.  Her wound site is red and inflamed.  However no palpable induration.  She is exquisitely tender along the site.  She consented to placement of topical let and oral pain control for suture removal.  Pain was grossly under control after these medications and sutures were able to be extracted.  No fluctuance on exam.  All sutures were extracted.  Given redness and concern for possible infection patient was started on Keflex  to prevent progressive infection and recommended follow-up/reassessment by a local primary care office within 1 week, phone number for community [facility] wellness center given to patient.  Clinical Impression:  1. Wound infection      Discharge   Final Clinical Impression(s) / ED Diagnoses Final diagnoses:  Wound infection    Rx / DC Orders ED Discharge Orders          Ordered    cephALEXin  (KEFLEX ) 500 MG capsule  3 times daily        01/16/24 0243              Jerral Meth, MD 01/16/24 9590    Jerral Meth, MD 01/16/24 0410

## 2024-01-16 MED ORDER — LIDOCAINE-EPINEPHRINE (PF) 2 %-1:200000 IJ SOLN
10.0000 mL | Freq: Once | INTRAMUSCULAR | Status: AC
  Administered 2024-01-16: 10 mL via INTRADERMAL
  Filled 2024-01-16: qty 20

## 2024-01-16 MED ORDER — ACETAMINOPHEN 500 MG PO TABS
1000.0000 mg | ORAL_TABLET | Freq: Once | ORAL | Status: AC
  Administered 2024-01-16: 1000 mg via ORAL
  Filled 2024-01-16: qty 2

## 2024-01-16 MED ORDER — OXYCODONE HCL 5 MG PO TABS
10.0000 mg | ORAL_TABLET | Freq: Once | ORAL | Status: AC
  Administered 2024-01-16: 10 mg via ORAL
  Filled 2024-01-16: qty 2

## 2024-01-16 MED ORDER — CEPHALEXIN 250 MG PO CAPS
500.0000 mg | ORAL_CAPSULE | Freq: Once | ORAL | Status: DC
  Filled 2024-01-16: qty 2

## 2024-01-16 MED ORDER — CEPHALEXIN 500 MG PO CAPS: 500.0000 mg | ORAL_CAPSULE | Freq: Three times a day (TID) | ORAL | 0 refills | Status: AC

## 2024-04-23 ENCOUNTER — Emergency Department (HOSPITAL_BASED_OUTPATIENT_CLINIC_OR_DEPARTMENT_OTHER)

## 2024-04-23 ENCOUNTER — Emergency Department (HOSPITAL_BASED_OUTPATIENT_CLINIC_OR_DEPARTMENT_OTHER)
Admission: EM | Admit: 2024-04-23 | Discharge: 2024-04-23 | Disposition: A | Discharge status: Home / Self Care | Attending: Emergency Medicine | Admitting: Emergency Medicine

## 2024-04-23 ENCOUNTER — Other Ambulatory Visit: Payer: Self-pay

## 2024-04-23 ENCOUNTER — Encounter (HOSPITAL_BASED_OUTPATIENT_CLINIC_OR_DEPARTMENT_OTHER): Payer: Self-pay | Admitting: Emergency Medicine

## 2024-04-23 DIAGNOSIS — I10 Essential (primary) hypertension: Secondary | ICD-10-CM | POA: Insufficient documentation

## 2024-04-23 DIAGNOSIS — W182XXA Fall in (into) shower or empty bathtub, initial encounter: Secondary | ICD-10-CM | POA: Diagnosis not present

## 2024-04-23 DIAGNOSIS — M545 Low back pain, unspecified: Secondary | ICD-10-CM | POA: Diagnosis present

## 2024-04-23 DIAGNOSIS — W19XXXA Unspecified fall, initial encounter: Secondary | ICD-10-CM

## 2024-04-23 LAB — CBC WITH DIFFERENTIAL/PLATELET
Abs Immature Granulocytes: 0.02 K/uL (ref 0.00–0.07)
Basophils Absolute: 0 K/uL (ref 0.0–0.1)
Basophils Relative: 1 %
Eosinophils Absolute: 0.2 K/uL (ref 0.0–0.5)
Eosinophils Relative: 3 %
HCT: 33.5 % — ABNORMAL LOW (ref 36.0–46.0)
Hemoglobin: 11 g/dL — ABNORMAL LOW (ref 12.0–15.0)
Immature Granulocytes: 0 %
Lymphocytes Relative: 29 %
Lymphs Abs: 1.8 K/uL (ref 0.7–4.0)
MCH: 25.5 pg — ABNORMAL LOW (ref 26.0–34.0)
MCHC: 32.8 g/dL (ref 30.0–36.0)
MCV: 77.7 fL — ABNORMAL LOW (ref 80.0–100.0)
Monocytes Absolute: 0.5 K/uL (ref 0.1–1.0)
Monocytes Relative: 9 %
Neutro Abs: 3.5 K/uL (ref 1.7–7.7)
Neutrophils Relative %: 58 %
Platelets: 374 K/uL (ref 150–400)
RBC: 4.31 MIL/uL (ref 3.87–5.11)
RDW: 15 % (ref 11.5–15.5)
WBC: 6.1 K/uL (ref 4.0–10.5)
nRBC: 0 % (ref 0.0–0.2)

## 2024-04-23 LAB — COMPREHENSIVE METABOLIC PANEL WITH GFR
ALT: 21 U/L (ref 0–44)
AST: 20 U/L (ref 15–41)
Albumin: 4.3 g/dL (ref 3.5–5.0)
Alkaline Phosphatase: 86 U/L (ref 38–126)
Anion gap: 13 (ref 5–15)
BUN: 9 mg/dL (ref 6–20)
CO2: 21 mmol/L — ABNORMAL LOW (ref 22–32)
Calcium: 9.4 mg/dL (ref 8.9–10.3)
Chloride: 105 mmol/L (ref 98–111)
Creatinine, Ser: 0.81 mg/dL (ref 0.44–1.00)
GFR, Estimated: >60 mL/min
Glucose, Bld: 90 mg/dL (ref 70–99)
Potassium: 3.8 mmol/L (ref 3.5–5.1)
Sodium: 138 mmol/L (ref 135–145)
Total Bilirubin: 0.3 mg/dL (ref 0.0–1.2)
Total Protein: 7.5 g/dL (ref 6.5–8.1)

## 2024-04-23 LAB — HCG, SERUM, QUALITATIVE: Preg, Serum: NEGATIVE

## 2024-04-23 MED ORDER — HYDROCODONE-ACETAMINOPHEN 5-325 MG PO TABS
1.0000 | ORAL_TABLET | Freq: Once | ORAL | Status: AC
  Administered 2024-04-23: 1 via ORAL
  Filled 2024-04-23: qty 1

## 2024-04-23 MED ORDER — LIDOCAINE 5 % EX PTCH: 1.0000 | MEDICATED_PATCH | CUTANEOUS | 0 refills | Status: AC

## 2024-04-23 MED ORDER — CYCLOBENZAPRINE HCL 10 MG PO TABS: 10.0000 mg | ORAL_TABLET | Freq: Two times a day (BID) | ORAL | 0 refills | Status: AC | PRN

## 2024-04-23 MED ORDER — KETOROLAC TROMETHAMINE 15 MG/ML IJ SOLN
15.0000 mg | Freq: Once | INTRAMUSCULAR | Status: AC
  Administered 2024-04-23: 15 mg via INTRAVENOUS
  Filled 2024-04-23: qty 1

## 2024-04-23 NOTE — ED Triage Notes (Signed)
 Pt from home , BIB [facility] , fell 3 days ago and again this morning . Lower back pain radiating to left leg , denies tingling or numbness . SABRA Given 50 mcg IV prior to arrival .

## 2024-04-23 NOTE — ED Notes (Signed)
 Patient transported to CT

## 2024-04-23 NOTE — ED Provider Notes (Signed)
 " [place] [facility] Provider Note   CSN: [id number] Arrival date & time: 04/23/24  1012     Patient presents with: Fall  HPI Morgan Lewis is a 29 y.o. female presenting for fall.  She states that 3 days ago she slipped and fell in the shower and landed on her buttock.  Since then she has had right buttock pain that does extend to the midline of the lower back.  She denies abdominal pain.  She states she was trying to walk to the bathroom today and due to the pain fell again to the ground.  Denies head injury or loss of consciousness.  Did states she felt a little bit lightheaded at that time but no preceding chest pain, shortness of breath or palpitations.  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   Depression    has never taken meds   Generalized anxiety disorder    Headache    Hypertension    Obesity    Supervision of high risk pregnancy, antepartum 12/03/2016    Clinic  [facility] Prenatal Labs Dating  by lmp 03/23/17  Blood type: O/Negative/-- (08/30 0920)  Genetic Screen Too late Antibody:Negative (08/30 0920) Anatomic US   Rubella: 1.14 (08/30 0920) GTT Third trimester:  RPR: Non Reactive (08/30 0920)  Flu vaccine  HBsAg: Negative (08/30 0920)  TDaP vaccine                                              HIV:   Negative Baby Food   undecided                        Fall       Prior to Admission medications  Medication Sig Start Date End Date Taking? Authorizing Provider  cyclobenzaprine  (FLEXERIL ) 10 MG tablet Take 1 tablet (10 mg total) by mouth 2 (two) times daily as needed for muscle spasms. 04/23/24  Yes Kess Mcilwain K, PA-C  lidocaine  (LIDODERM ) 5 % Place 1 patch onto the skin daily. Remove & Discard patch within 12 hours or as directed by MD 04/23/24  Yes Lang Norleen POUR, PA-C  Blood Pressure  Monitoring KIT 1 kit by Does not apply route once a week. 10/25/18   Rogers, Veronica C, CNM  cephALEXin  (KEFLEX ) 500 MG capsule Take 1 capsule (500 mg total) by mouth 3 (three) times daily. 01/16/24   Jerral Meth, MD  ibuprofen  (ADVIL ) 800 MG tablet Take 1 tablet (800 mg total) by mouth every 8 (eight) hours as needed. 02/21/19   Edmundo Moats D, CNM  traMADol  (ULTRAM ) 50 MG tablet Take 1 tablet (50 mg total) by mouth every 6 (six) hours as needed. 10/10/23   Odell Balls, PA-C    Allergies: Oxycodone     Review of Systems See HPI  Updated Vital Signs BP 102/64   Pulse 68   Temp 99.1 F (37.3 C) (Oral)   Resp 18   SpO2 100%   Physical Exam Vitals and nursing note reviewed.  HENT:     Head: Normocephalic and atraumatic.     Mouth/Throat:     Mouth: Mucous membranes are moist.  Eyes:     General:  Right eye: No discharge.        Left eye: No discharge.     Conjunctiva/sclera: Conjunctivae normal.  Cardiovascular:     Rate and Rhythm: Normal rate and regular rhythm.     Pulses: Normal pulses.          Dorsalis pedis pulses are 2+ on the right side and 2+ on the left side.     Heart sounds: Normal heart sounds.  Pulmonary:     Effort: Pulmonary effort is normal.     Breath sounds: Normal breath sounds.  Abdominal:     General: Abdomen is flat.     Palpations: Abdomen is soft.  Musculoskeletal:       Back:     Comments: Ambulatory with steady gait.  Skin:    General: Skin is warm and dry.  Neurological:     General: No focal deficit present.  Psychiatric:        Mood and Affect: Mood normal.     (all labs ordered are listed, but only abnormal results are displayed) Labs Reviewed  CBC WITH DIFFERENTIAL/PLATELET - Abnormal; Notable for the following components:      Result Value   Hemoglobin 11.0 (*)    HCT 33.5 (*)    MCV 77.7 (*)    MCH 25.5 (*)    All other components within normal limits  COMPREHENSIVE METABOLIC PANEL WITH GFR - Abnormal;  Notable for the following components:   CO2 21 (*)    All other components within normal limits  HCG, SERUM, QUALITATIVE    EKG: None  Radiology: DG Pelvis Portable Result Date: 04/23/2024 EXAM: 1 OR 2 VIEW(S) XRAY OF THE PELVIS 04/23/2024 12:15:00 PM COMPARISON: None available. CLINICAL HISTORY: Trauma Trauma Trauma FINDINGS: BONES AND JOINTS: No acute fracture. No malalignment. SOFT TISSUES: There are surgical clips present in the left pelvis. IMPRESSION: 1. No evidence of acute traumatic injury of the bony pelvis; incidental left pelvic surgical clips. Electronically signed by: Evalene Coho MD 04/23/2024 12:28 PM EST RP Workstation: [id number]   CT Lumbar Spine Wo Contrast Result Date: 04/23/2024 EXAM: CT OF THE LUMBAR SPINE WITHOUT CONTRAST 04/23/2024 12:15:00 PM TECHNIQUE: CT of the lumbar spine was performed without the administration of intravenous contrast. Multiplanar reformatted images are provided for review. Automated exposure control, iterative reconstruction, and/or weight based adjustment of the mA/kV was utilized to reduce the radiation dose to as low as reasonably achievable. COMPARISON: None available. CLINICAL HISTORY: trauma trauma trauma trauma FINDINGS: BONES AND ALIGNMENT: Normal vertebral body heights. There is a small chronic Schmorl's node within the superior endplate of L4. 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 There is no evidence of acute traumatic injury. No suspicious bone lesion. Normal alignment. DEGENERATIVE CHANGES: There is a small chronic Schmorl's node within the superior endplate of L4. SOFT TISSUES: No acute abnormality. IMPRESSION: 1. No evidence of acute traumatic injury. 2. Small chronic Schmorl's node within the superior endplate of L4. 3. 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 Electronically signed by: Evalene Coho MD 04/23/2024 12:28 PM EST RP Workstation: [id number]      Procedures   Medications Ordered in the ED  HYDROcodone -acetaminophen  (NORCO/VICODIN) 5-325 MG per tablet 1 tablet (1 tablet Oral Given 04/23/24 1045)  ketorolac  (TORADOL ) 15 MG/ML injection 15 mg (15 mg Intravenous Given 04/23/24 1309)  Medical Decision Making Amount and/or Complexity of Data Reviewed Labs: ordered. Radiology: ordered.  Risk Prescription drug management.   29 year old well-appearing female presenting for falls.  Exam notable for some tenderness in the left buttock but otherwise reassuring.  Patient has claimed multiple times during this counter that she cannot walk.  I was able to ambulate her and she walks without a limp back and forth in the room several times.  States she was feeling better after treatment.  Advised supportive care and NSAIDs for what I think is soft tissue injury in the left lower back and left buttock.  Sent muscle relaxers and Lidoderm  patches as well to her pharmacy.  Advised her to follow-up her PCP.  Discussed return precautions.  Discharged in good condition.  Did discuss CT findings as well with her.     Final diagnoses:  Fall, initial encounter    ED Discharge Orders          Ordered    cyclobenzaprine  (FLEXERIL ) 10 MG tablet  2 times daily PRN        04/23/24 1311    lidocaine  (LIDODERM ) 5 %  Every 24 hours        04/23/24 1311               Lang Norleen POUR, PA-C 04/23/24 1312  "

## 2024-04-23 NOTE — ED Notes (Signed)
 ED Provider at bedside.

## 2024-04-23 NOTE — Discharge Instructions (Addendum)
 Evaluation today was overall reassuring.  Suspect you have a bruise in your left buttock and lower back.  Would recommend treating it conservatively with gentle stretching daily ibuprofen  and Tylenol  and you can also use muscle relaxers and Lidoderm  patches which I have sent to your pharmacy.  Please follow-up with your PCP.
